# Patient Record
Sex: Male | Born: 1950 | Race: White | Hispanic: No | Marital: Married | Smoking: Never smoker
Health system: Southern US, Community
[De-identification: ages and names within clinical notes are randomized; demographics above are authoritative.]

## PROBLEM LIST (undated history)

## (undated) DIAGNOSIS — R011 Cardiac murmur, unspecified: Secondary | ICD-10-CM

## (undated) DIAGNOSIS — H269 Unspecified cataract: Secondary | ICD-10-CM

## (undated) DIAGNOSIS — I10 Essential (primary) hypertension: Secondary | ICD-10-CM

## (undated) DIAGNOSIS — M1990 Unspecified osteoarthritis, unspecified site: Secondary | ICD-10-CM

## (undated) HISTORY — DX: Unspecified osteoarthritis, unspecified site: M19.90

## (undated) HISTORY — DX: Essential (primary) hypertension: I10

## (undated) HISTORY — DX: Cardiac murmur, unspecified: R01.1

## (undated) HISTORY — DX: Unspecified cataract: H26.9

## (undated) HISTORY — PX: HERNIA REPAIR: SHX51

## (undated) HISTORY — PX: CARDIAC VALVE REPLACEMENT: SHX585

---

## 2000-10-29 ENCOUNTER — Ambulatory Visit (HOSPITAL_COMMUNITY): Admission: RE | Admit: 2000-10-29 | Discharge: 2000-10-29 | Payer: Self-pay | Admitting: General Surgery

## 2000-10-29 ENCOUNTER — Encounter (INDEPENDENT_AMBULATORY_CARE_PROVIDER_SITE_OTHER): Payer: Self-pay | Admitting: Specialist

## 2005-02-20 ENCOUNTER — Ambulatory Visit: Payer: Self-pay | Admitting: Family Medicine

## 2005-02-28 ENCOUNTER — Ambulatory Visit: Payer: Self-pay | Admitting: Family Medicine

## 2009-03-29 ENCOUNTER — Encounter (INDEPENDENT_AMBULATORY_CARE_PROVIDER_SITE_OTHER): Payer: Self-pay | Admitting: *Deleted

## 2009-03-30 ENCOUNTER — Ambulatory Visit: Payer: Self-pay | Admitting: Gastroenterology

## 2009-04-04 ENCOUNTER — Ambulatory Visit: Payer: Self-pay | Admitting: Gastroenterology

## 2009-04-05 ENCOUNTER — Encounter: Payer: Self-pay | Admitting: Gastroenterology

## 2010-04-03 NOTE — Letter (Signed)
Summary: [name] Instructions  [facility]  [address] [place], [place] [address]   Phone: [phone]  Fax: [phone]       Jeremy Yates    [birth year]    MRN: [id number]        Procedure Day [name]:  [name]  04/04/09     Arrival Time:  9:00AM     Procedure Time:  10:00AM     Location of Procedure:                    _ X_  [facility] (4th Floor)     PREPARATION FOR COLONOSCOPY WITH MOVIPREP   Starting 5 days prior to your procedure 03/30/09 do not eat nuts, seeds, popcorn, corn, beans, peas,  salads, or any raw vegetables.  Do not take any fiber supplements (e.g. Metamucil, Citrucel, and Benefiber).  THE DAY BEFORE YOUR PROCEDURE         DATE: 04/03/09 DAY: MONDAY  1.  Drink clear liquids the entire day-NO SOLID FOOD  2.  Do not drink anything colored red or purple.  Avoid juices with pulp.  No orange juice.  3.  Drink at least 64 oz. (8 glasses) of fluid/clear liquids during the day to prevent dehydration and help the prep work efficiently.  CLEAR LIQUIDS INCLUDE: Water Jello Ice Popsicles Tea (sugar ok, no milk/cream) Powdered fruit flavored drinks Coffee (sugar ok, no milk/cream) Gatorade Juice: apple, white grape, white cranberry  Lemonade Clear bullion, consomm, broth Carbonated beverages (any kind) Strained chicken noodle soup Hard Candy                             4.  In the morning, mix first dose of MoviPrep solution:    Empty 1 Pouch A and 1 Pouch B into the disposable container    Add lukewarm drinking water to the top line of the container. Mix to dissolve    Refrigerate (mixed solution should be used within 24 hrs)  5.  Begin drinking the prep at 5:00 p.m. The MoviPrep container is divided by 4 marks.   Every 15 minutes drink the solution down to the next mark (approximately 8 oz) until the full liter is complete.   6.  Follow completed prep with 16 oz of clear liquid of your choice (Nothing red or purple).   Continue to drink clear liquids until bedtime.  7.  Before going to bed, mix second dose of MoviPrep solution:    Empty 1 Pouch A and 1 Pouch B into the disposable container    Add lukewarm drinking water to the top line of the container. Mix to dissolve    Refrigerate  THE DAY OF YOUR PROCEDURE      DATE: 04/04/09 DAY: TUESDAY  Beginning at 5:00AM (5 hours before procedure):         1. Every 15 minutes, drink the solution down to the next mark (approx 8 oz) until the full liter is complete.  2. Follow completed prep with 16 oz. of clear liquid of your choice.    3. You may drink clear liquids until 8:00AM (2 HOURS BEFORE PROCEDURE).   MEDICATION INSTRUCTIONS  Unless otherwise instructed, you should take regular prescription medications with a small sip of water   as early as possible the morning of your procedure.          OTHER INSTRUCTIONS  You will need a responsible adult  at least 58 years of age to accompany you and drive you home (at least 58 years of age).   This person must remain in the waiting room during your procedure.  Wear loose fitting clothing that is easily removed.  Leave jewelry and other valuables at home.  However, you may wish to bring a book to read or  an iPod/MP3 player to listen to music as you wait for your procedure to start.  Remove all body piercing jewelry and leave at home.  Total time from sign-in until discharge is approximately 2-3 hours.  You should go home directly after your procedure and rest.  You can resume normal activities the  day after your procedure.  The day of your procedure you should not:   Drive   Make legal decisions   Operate machinery   Drink alcohol   Return to work  You will receive specific instructions about eating, activities and medications before you leave.    The above instructions have been reviewed and explained to me by  Karl Bales RN  March 30, 2009 8:13 AM       I fully understand and can verbalize  these instructions _____________________________ Date _________

## 2010-04-03 NOTE — Procedures (Signed)
Summary: Colonoscopy  Patient: Jeremy Yates Note: All result statuses are Final unless otherwise noted.  Tests: (1) Colonoscopy (COL)   COL Colonoscopy           DONE     [facility]     [address]     [place], [place]  [address]           COLONOSCOPY PROCEDURE REPORT           PATIENT:  Jeremy Yates, Jeremy Yates  MR#:  [id number]     BIRTHDATE:  03/04/1951, 58 yrs. old  GENDER:  male           ENDOSCOPIST:  Judie Petit T. Russella Dar, MD, [facility]           PROCEDURE DATE:  04/04/2009     PROCEDURE:  Colonoscopy with snare polypectomy     ASA CLASS:  Class II     INDICATIONS:  1) Routine Risk Screening           MEDICATIONS:   Fentanyl 50 mcg IV, Versed 6 mg IV           DESCRIPTION OF PROCEDURE:   After the risks benefits and     alternatives of the procedure were thoroughly explained, informed     consent was obtained.  Digital rectal exam was performed and     revealed no abnormalities.   The LB [id number] endoscope     was introduced through the anus and advanced to the cecum, which     was identified by both the appendix and ileocecal valve, without     limitations.  The quality of the prep was good, using MoviPrep.     The instrument was then slowly withdrawn as the colon was fully     examined.     <<PROCEDUREIMAGES>>           FINDINGS:  A sessile polyp was found in the ascending colon. It     was 6 mm in size.  Polyp was snared without cautery. Retieval was     successful. A sessile polyp was found in the descending colon. It     was 5 mm in size. Polyp was snared without cautery. Retrieval was     successful. A sessile polyp was found in the sigmoid colon. It was     3 mm in size. Polyp was snared without cautery. Retrieval was     successful. This was otherwise a normal examination of the colon.     Retroflexed views in the rectum revealed no abnormalities. The     time to cecum =  1.5  minutes. The scope was then withdrawn (time     =  11  min) from the patient and  the procedure completed.           COMPLICATIONS:  None           ENDOSCOPIC IMPRESSION:     1) 6 mm sessile polyp in the ascending colon     2) 5 mm sessile polyp in the descending colon     3) 3 mm sessile polyp in the sigmoid colon           RECOMMENDATIONS:     1) Await pathology results     2) If the polyps removed today are adenomatous (pre-cancerous),     you will need a repeat colonoscopy in 5 years. Otherwise you     should continue to follow colorectal cancer screening guidelines  for "routine risk" patients with colonoscopy in 10 years.     Venita Lick. Russella Dar, MD, [facility]           CC: Tawny Asal, MD           n.     Rosalie DoctorVenita Lick. Anysha Frappier at 04/04/2009 10:46 AM           Carren Rang, [id number]  Note: An exclamation mark (!) indicates a result that was not dispersed into the flowsheet. Document Creation Date: 04/04/2009 10:47 AM _______________________________________________________________________  (1) Order result status: Final Collection or observation date-time: 04/04/2009 10:40 Requested date-time:  Receipt date-time:  Reported date-time:  Referring Physician:   Ordering Physician: Claudette Head [phone]) Specimen Source:  Source: Launa Grill Order Number: [id number] Lab site:   Appended Document: Colonoscopy     Procedures Next Due Date:    Colonoscopy: 04/2014  Appended Document: Colonoscopy reviewed

## 2010-04-03 NOTE — Miscellaneous (Signed)
Summary: [facility]  Clinical Lists Changes

## 2010-04-03 NOTE — Miscellaneous (Signed)
Summary: moviprep rx  Clinical Lists Changes  Medications: Added new medication of MOVIPREP 100 GM  SOLR (PEG-KCL-NACL-NASULF-NA ASC-C) As per prep instructions. - Signed Rx of MOVIPREP 100 GM  SOLR (PEG-KCL-NACL-NASULF-NA ASC-C) As per prep instructions.;  #1 x 0;  Signed;  Entered by: Karl Bales RN;  Authorized by: Meryl Dare MD [facility];  Method used: Electronically to [facility]*, [address] [place], [place], [place]  [address], Ph: [phone], Fax: [phone] Observations: Added new observation of NKA: T (03/30/2009 8:11)    Prescriptions: MOVIPREP 100 GM  SOLR (PEG-KCL-NACL-NASULF-NA ASC-C) As per prep instructions.  #1 x 0   Entered by:   Karl Bales RN   Authorized by:   Meryl Dare MD [facility]   Signed by:   Karl Bales RN on 03/30/2009   Method used:   Electronically to        [facility]* (retail)       [address] [place], [place]  [address]       Ph: [phone]       Fax: [phone]   RxID:   [id number]

## 2010-04-03 NOTE — Letter (Signed)
Summary: Patient Notice- Polyp Results  [facility]  [address] [place], [place] [address]   Phone: [phone]  Fax: [phone]        April 05, 2009 MRN: [id number]    ARLENE GENOVA [address] [place], [place]  [address]    Dear Mr. LIBY,  I am pleased to inform you that the colon polyp(s) removed during your recent colonoscopy was (were) found to be benign (no cancer detected) upon pathologic examination.  I recommend you have a repeat colonoscopy examination in 5 years to look for recurrent polyps, as having colon polyps increases your risk for having recurrent polyps or even colon cancer in the future.  Should you develop new or worsening symptoms of abdominal pain, bowel habit changes or bleeding from the rectum or bowels, please schedule an evaluation with either your primary care physician or with me.  Continue treatment plan as outlined the day of your exam.  Please call us if you are having persistent problems or have questions about your condition that have not been fully answered at this time.  Sincerely,  Meryl Dare MD [facility]  This letter has been electronically signed by your physician.  Appended Document: Patient Notice- Polyp Results letter mailed 2.3.11

## 2010-07-20 NOTE — Op Note (Signed)
[facility]  Patient:    Jeremy Yates, Jeremy Yates Visit Number: [id number] MRN: [id number]          Service Type: DSU Location: DAY Attending Physician:  Carson Myrtle Proc. Date: 10/29/00 Adm. Date:  10/29/2000                             Operative Report  PREOPERATIVE DIAGNOSIS:  Chronic pilonidal cyst and abscess.  POSTOPERATIVE DIAGNOSIS:  Chronic pilonidal cyst and abscess.  OPERATIVE PROCEDURE:  Excision of pilonidal cyst and fistula.  SURGEON:  Timothy E. Earlene Plater, M.D.  ANESTHESIA:  General.  INDICATIONS:  [name] is newly moved to [place].  He had an incision drainage of pilonidal abscess in [place] about a month ago.  He has been on antibiotics, and he is now ready to proceed with a definitive excision.  DESCRIPTION OF PROCEDURE:  The patient was evaluated by anesthesia, taken to the operating room, general endotracheal anesthesia provided.  He was carefully taped in position and then turned in the prone position, again, carefully positioned and padded.  The buttocks were shaved and then slightly spread.  The fistulous opening was at the previous incision drainage site, approximately 3 cm to the left of the midline.  A malleable probe was placed there after prep and drape of the skin.  It did, in fact, probe to the midline.  An incision was made in a curvilinear fashion to remove all of the inflamed skin from the I&D site to the midline.  Careful sharp dissection removed all evidence of fistula, granulation tissue, and infection.  This was submitted to pathology.  Bleeding was controlled with the cautery, and the wound was closed in layers with 2-0 Vicryl and 2-0 nylon.  He tolerated it well.  Counts correct.  Dry sterile dressing applied, and he was then turned back into the supine position, extubated, and taken to the recovery room in good condition. Attending Physician:  Carson Myrtle DD:  10/29/00 TD:  10/29/00 Job:  [id number] [id number]

## 2014-04-15 ENCOUNTER — Encounter: Payer: Self-pay | Admitting: Gastroenterology

## 2014-08-22 ENCOUNTER — Ambulatory Visit (INDEPENDENT_AMBULATORY_CARE_PROVIDER_SITE_OTHER): Payer: BLUE CROSS/BLUE SHIELD | Admitting: Podiatry

## 2014-08-22 DIAGNOSIS — L600 Ingrowing nail: Secondary | ICD-10-CM

## 2014-08-22 MED ORDER — NEOMYCIN-POLYMYXIN-HC 3.5-10000-1 OT SOLN: OTIC | Status: DC

## 2014-08-22 NOTE — Progress Notes (Signed)
   Subjective:    Patient ID: Jeremy Yates, male    DOB: January 17, 1951, 64 y.o.   MRN: [id number]  HPI Pt presents with bilateral great ingrown nail medial border, he states he has successfuly tried to remove them for over the past 3 years but this time they are not going away   Review of Systems  All other systems reviewed and are negative.      Objective:   Physical Exam        Assessment & Plan:

## 2014-08-22 NOTE — Patient Instructions (Signed)

## 2014-08-23 NOTE — Progress Notes (Signed)
Subjective:     Patient ID: Jeremy Yates, male   DOB: 12-12-1950, 64 y.o.   MRN: [id number]  HPI patient presents stating my ingrown toenails are really bothering me and make it hard for me to wear shoe gear comfortably and that I have tried to trim them myself and soak them without relief   Review of Systems  All other systems reviewed and are negative.      Objective:   Physical Exam  Constitutional: He is oriented to person, place, and time.  Cardiovascular: Intact distal pulses.   Musculoskeletal: Normal range of motion.  Neurological: He is oriented to person, place, and time.  Skin: Skin is warm.  Nursing note and vitals reviewed.  neurovascular status intact muscle strength adequate with range of motion subtalar midtarsal joint found to be within normal limits. 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 I've recommended removal of the corners and I explained procedure and risk associated with this. Patient wants the procedures understanding risk and today I went ahead and I filtrated each hallux 60 mg Xylocaine Marcaine mixture remove the lateral borders exposed matrix and applied phenol 3 applications 30 seconds followed by alcohol lavage and sterile dressing. Gave instructions on soaks and reappoint

## 2014-08-31 ENCOUNTER — Ambulatory Visit: Payer: Self-pay | Admitting: Podiatry

## 2014-10-25 ENCOUNTER — Encounter: Payer: Self-pay | Admitting: Gastroenterology

## 2017-10-21 DIAGNOSIS — R430 Anosmia: Secondary | ICD-10-CM | POA: Insufficient documentation

## 2017-10-21 DIAGNOSIS — R432 Parageusia: Secondary | ICD-10-CM | POA: Insufficient documentation

## 2017-11-19 DIAGNOSIS — J343 Hypertrophy of nasal turbinates: Secondary | ICD-10-CM | POA: Insufficient documentation

## 2017-11-19 DIAGNOSIS — R040 Epistaxis: Secondary | ICD-10-CM

## 2017-11-19 DIAGNOSIS — J342 Deviated nasal septum: Secondary | ICD-10-CM | POA: Insufficient documentation

## 2017-11-19 HISTORY — DX: Epistaxis: R04.0

## 2018-08-15 LAB — LAB REPORT - SCANNED
A1c: 5.9
EGFR (Non-African Amer.): 86

## 2018-09-25 ENCOUNTER — Other Ambulatory Visit: Payer: Self-pay | Admitting: Family Medicine

## 2018-09-25 DIAGNOSIS — N401 Enlarged prostate with lower urinary tract symptoms: Secondary | ICD-10-CM

## 2018-09-25 DIAGNOSIS — N138 Other obstructive and reflux uropathy: Secondary | ICD-10-CM

## 2018-09-25 DIAGNOSIS — R972 Elevated prostate specific antigen [PSA]: Secondary | ICD-10-CM

## 2018-09-25 NOTE — Progress Notes (Signed)
PT w/ Urinary sx and increasing PSA w/ %free PSA of 17.2% which correlates w/ 23% risk malignancy given age

## 2018-10-29 ENCOUNTER — Ambulatory Visit
Admission: RE | Admit: 2018-10-29 | Discharge: 2018-10-29 | Disposition: A | Discharge status: Home / Self Care | Payer: PRIVATE HEALTH INSURANCE | Source: Ambulatory Visit | Attending: Family Medicine | Admitting: Family Medicine

## 2018-10-29 ENCOUNTER — Other Ambulatory Visit: Payer: Self-pay

## 2018-10-29 DIAGNOSIS — N401 Enlarged prostate with lower urinary tract symptoms: Secondary | ICD-10-CM

## 2018-10-29 DIAGNOSIS — R972 Elevated prostate specific antigen [PSA]: Secondary | ICD-10-CM

## 2018-10-29 DIAGNOSIS — N138 Other obstructive and reflux uropathy: Secondary | ICD-10-CM

## 2018-10-29 MED ORDER — GADOBENATE DIMEGLUMINE 529 MG/ML IV SOLN
20.0000 mL | Freq: Once | INTRAVENOUS | Status: AC | PRN
  Administered 2018-10-29: 10:00:00 20 mL via INTRAVENOUS

## 2019-04-03 ENCOUNTER — Ambulatory Visit: Payer: Self-pay

## 2019-04-08 ENCOUNTER — Ambulatory Visit: Payer: PRIVATE HEALTH INSURANCE

## 2019-04-08 ENCOUNTER — Ambulatory Visit: Payer: Self-pay

## 2019-04-10 ENCOUNTER — Ambulatory Visit: Payer: BC Managed Care – PPO | Attending: Internal Medicine

## 2019-04-10 DIAGNOSIS — Z23 Encounter for immunization: Secondary | ICD-10-CM | POA: Insufficient documentation

## 2019-04-10 NOTE — Progress Notes (Signed)
   Covid-19 Vaccination Clinic  Name:  Jeremy Yates    MRN: [id number] DOB: 06-11-1950  04/10/2019  Mr. Jeremy Yates was observed post Covid-19 immunization for 15 minutes without incidence. He was provided with Vaccine Information Sheet and instruction to access the V-Safe system.   Mr. Jeremy Yates was instructed to call 911 with any severe reactions post vaccine: Marland Kitchen Difficulty breathing  . Swelling of your face and throat  . A fast heartbeat  . A bad rash all over your body  . Dizziness and weakness    Immunizations Administered    Name Date Dose VIS Date Route   Pfizer COVID-19 Vaccine 04/10/2019  5:54 PM 0.3 mL 02/12/2019 Intramuscular   Manufacturer: [employer], [employer]   Lot: [id number]   NDC: [id number]

## 2019-05-05 ENCOUNTER — Ambulatory Visit: Payer: BC Managed Care – PPO | Attending: Internal Medicine

## 2019-05-05 DIAGNOSIS — Z23 Encounter for immunization: Secondary | ICD-10-CM | POA: Insufficient documentation

## 2019-05-05 NOTE — Progress Notes (Signed)
   Covid-19 Vaccination Clinic  Name:  Jeremy Yates    MRN: [id number] DOB: 05/05/1950  05/05/2019  [name] was observed post Covid-19 immunization for 15 minutes without incident. He was provided with Vaccine Information Sheet and instruction to access the V-Safe system.   [name] was instructed to call 911 with any severe reactions post vaccine: Marland Kitchen Difficulty breathing  . Swelling of face and throat  . A fast heartbeat  . A bad rash all over body  . Dizziness and weakness   Immunizations Administered    Name Date Dose VIS Date Route   Pfizer COVID-19 Vaccine 05/05/2019  2:35 PM 0.3 mL 02/12/2019 Intramuscular   Manufacturer: [employer], [employer]   Lot: [id number]   NDC: [address]-2

## 2019-06-25 ENCOUNTER — Other Ambulatory Visit: Payer: Self-pay | Admitting: Family Medicine

## 2019-06-25 DIAGNOSIS — Z125 Encounter for screening for malignant neoplasm of prostate: Secondary | ICD-10-CM

## 2019-06-25 DIAGNOSIS — R011 Cardiac murmur, unspecified: Secondary | ICD-10-CM

## 2019-06-25 DIAGNOSIS — R0989 Other specified symptoms and signs involving the circulatory and respiratory systems: Secondary | ICD-10-CM

## 2019-06-25 DIAGNOSIS — R3914 Feeling of incomplete bladder emptying: Secondary | ICD-10-CM

## 2019-06-25 DIAGNOSIS — N401 Enlarged prostate with lower urinary tract symptoms: Secondary | ICD-10-CM

## 2019-06-25 NOTE — Progress Notes (Signed)
Urinary hesitancy and incomplete bladder emptying continue. PSA climbing (slightly) from 1.5-1.9.  Previous Prostate MRI PIRADS-3 lesion noted.   Systolic murmur now IV/VI w/ bilat carotid bruits noted.   Shelly Flatten, MD Family Medicine 06/25/2019, 1:50 PM

## 2019-07-02 ENCOUNTER — Ambulatory Visit (HOSPITAL_BASED_OUTPATIENT_CLINIC_OR_DEPARTMENT_OTHER)
Admission: RE | Admit: 2019-07-02 | Discharge: 2019-07-02 | Disposition: A | Discharge status: Home / Self Care | Payer: BC Managed Care – PPO | Source: Ambulatory Visit | Attending: Family Medicine | Admitting: Family Medicine

## 2019-07-02 ENCOUNTER — Ambulatory Visit (HOSPITAL_COMMUNITY)
Admission: RE | Admit: 2019-07-02 | Discharge: 2019-07-02 | Disposition: A | Discharge status: Home / Self Care | Payer: BC Managed Care – PPO | Source: Ambulatory Visit | Attending: Family Medicine | Admitting: Family Medicine

## 2019-07-02 ENCOUNTER — Other Ambulatory Visit: Payer: Self-pay

## 2019-07-02 DIAGNOSIS — R011 Cardiac murmur, unspecified: Secondary | ICD-10-CM | POA: Diagnosis present

## 2019-07-02 DIAGNOSIS — R0989 Other specified symptoms and signs involving the circulatory and respiratory systems: Secondary | ICD-10-CM

## 2019-07-02 NOTE — Progress Notes (Signed)
  Echocardiogram 2D Echocardiogram has been performed.  Jeremy Yates 07/02/2019, 4:37 PM

## 2019-07-02 NOTE — Progress Notes (Signed)
Carotid duplex has been completed.   Preliminary results in CV Proc.   Blanch Media 07/02/2019 2:41 PM

## 2019-09-09 DIAGNOSIS — E6609 Other obesity due to excess calories: Secondary | ICD-10-CM | POA: Insufficient documentation

## 2019-09-10 DIAGNOSIS — Z953 Presence of xenogenic heart valve: Secondary | ICD-10-CM | POA: Insufficient documentation

## 2019-09-28 ENCOUNTER — Telehealth (HOSPITAL_COMMUNITY): Payer: Self-pay

## 2019-09-28 NOTE — Telephone Encounter (Signed)
Recv'ed CR referral, referral was signed by an NP. Faxed request to have an update referral sent over signed by a MD. Placed ppw in faxed awaiting folder.

## 2019-11-02 ENCOUNTER — Ambulatory Visit
Admission: RE | Admit: 2019-11-02 | Discharge: 2019-11-02 | Disposition: A | Discharge status: Home / Self Care | Payer: BC Managed Care – PPO | Source: Ambulatory Visit | Attending: Family Medicine | Admitting: Family Medicine

## 2019-11-02 DIAGNOSIS — Z125 Encounter for screening for malignant neoplasm of prostate: Secondary | ICD-10-CM

## 2019-11-02 DIAGNOSIS — R3914 Feeling of incomplete bladder emptying: Secondary | ICD-10-CM

## 2019-11-02 MED ORDER — GADOBENATE DIMEGLUMINE 529 MG/ML IV SOLN
20.0000 mL | Freq: Once | INTRAVENOUS | Status: AC | PRN
  Administered 2019-11-02: 20 mL via INTRAVENOUS

## 2019-12-10 ENCOUNTER — Other Ambulatory Visit: Payer: Self-pay | Admitting: Family Medicine

## 2019-12-10 ENCOUNTER — Ambulatory Visit
Admission: RE | Admit: 2019-12-10 | Discharge: 2019-12-10 | Disposition: A | Discharge status: Home / Self Care | Payer: BC Managed Care – PPO | Source: Ambulatory Visit | Attending: Family Medicine | Admitting: Family Medicine

## 2019-12-10 DIAGNOSIS — M25512 Pain in left shoulder: Secondary | ICD-10-CM

## 2020-07-07 ENCOUNTER — Other Ambulatory Visit: Payer: Self-pay | Admitting: Family Medicine

## 2020-07-07 ENCOUNTER — Ambulatory Visit (HOSPITAL_BASED_OUTPATIENT_CLINIC_OR_DEPARTMENT_OTHER)
Admission: RE | Admit: 2020-07-07 | Discharge: 2020-07-07 | Disposition: A | Discharge status: Home / Self Care | Payer: 59 | Source: Ambulatory Visit | Attending: Family Medicine | Admitting: Family Medicine

## 2020-07-07 ENCOUNTER — Other Ambulatory Visit: Payer: Self-pay

## 2020-07-07 ENCOUNTER — Encounter: Payer: Self-pay | Admitting: Family Medicine

## 2020-07-07 DIAGNOSIS — M25511 Pain in right shoulder: Secondary | ICD-10-CM

## 2020-07-07 DIAGNOSIS — M7541 Impingement syndrome of right shoulder: Secondary | ICD-10-CM

## 2020-07-07 HISTORY — DX: Impingement syndrome of right shoulder: M75.41

## 2020-07-07 NOTE — Progress Notes (Signed)
Rotator cuff impingement vs arthritis vs subacromial bursitis  S/p depomedrol injection 07/06/20 in PCP office.

## 2020-07-10 ENCOUNTER — Other Ambulatory Visit (HOSPITAL_BASED_OUTPATIENT_CLINIC_OR_DEPARTMENT_OTHER): Payer: Self-pay | Admitting: Family Medicine

## 2020-08-11 ENCOUNTER — Other Ambulatory Visit: Payer: Self-pay | Admitting: Orthopedic Surgery

## 2020-08-11 DIAGNOSIS — M67912 Unspecified disorder of synovium and tendon, left shoulder: Secondary | ICD-10-CM

## 2020-08-13 ENCOUNTER — Ambulatory Visit
Admission: RE | Admit: 2020-08-13 | Discharge: 2020-08-13 | Disposition: A | Discharge status: Home / Self Care | Payer: 59 | Source: Ambulatory Visit | Attending: Orthopedic Surgery | Admitting: Orthopedic Surgery

## 2020-08-13 DIAGNOSIS — M67912 Unspecified disorder of synovium and tendon, left shoulder: Secondary | ICD-10-CM

## 2020-08-14 ENCOUNTER — Other Ambulatory Visit: Payer: Self-pay | Admitting: Orthopedic Surgery

## 2020-08-14 DIAGNOSIS — M67911 Unspecified disorder of synovium and tendon, right shoulder: Secondary | ICD-10-CM

## 2020-12-02 DIAGNOSIS — N401 Enlarged prostate with lower urinary tract symptoms: Secondary | ICD-10-CM | POA: Insufficient documentation

## 2020-12-02 DIAGNOSIS — N400 Enlarged prostate without lower urinary tract symptoms: Secondary | ICD-10-CM | POA: Insufficient documentation

## 2020-12-02 DIAGNOSIS — I10 Essential (primary) hypertension: Secondary | ICD-10-CM | POA: Insufficient documentation

## 2021-01-03 ENCOUNTER — Other Ambulatory Visit: Payer: Self-pay | Admitting: Family Medicine

## 2021-01-03 DIAGNOSIS — N419 Inflammatory disease of prostate, unspecified: Secondary | ICD-10-CM

## 2021-01-03 DIAGNOSIS — R972 Elevated prostate specific antigen [PSA]: Secondary | ICD-10-CM

## 2021-01-24 ENCOUNTER — Ambulatory Visit
Admission: RE | Admit: 2021-01-24 | Discharge: 2021-01-24 | Disposition: A | Discharge status: Home / Self Care | Payer: 59 | Source: Ambulatory Visit | Attending: Family Medicine | Admitting: Family Medicine

## 2021-01-24 DIAGNOSIS — R972 Elevated prostate specific antigen [PSA]: Secondary | ICD-10-CM

## 2021-01-24 DIAGNOSIS — N419 Inflammatory disease of prostate, unspecified: Secondary | ICD-10-CM

## 2021-01-24 MED ORDER — GADOBENATE DIMEGLUMINE 529 MG/ML IV SOLN
20.0000 mL | Freq: Once | INTRAVENOUS | Status: AC | PRN
  Administered 2021-01-24: 20 mL via INTRAVENOUS

## 2021-02-05 ENCOUNTER — Ambulatory Visit: Payer: 59 | Attending: Internal Medicine

## 2021-02-05 ENCOUNTER — Other Ambulatory Visit (HOSPITAL_BASED_OUTPATIENT_CLINIC_OR_DEPARTMENT_OTHER): Payer: Self-pay

## 2021-02-05 ENCOUNTER — Other Ambulatory Visit: Payer: Self-pay

## 2021-02-05 DIAGNOSIS — Z23 Encounter for immunization: Secondary | ICD-10-CM

## 2021-02-05 MED ORDER — PFIZER COVID-19 VAC BIVALENT 30 MCG/0.3ML IM SUSP
INTRAMUSCULAR | 0 refills | Status: DC
  Filled 2021-02-05: qty 0.3, 1d supply, fill #0

## 2021-02-05 NOTE — Progress Notes (Signed)
   Covid-19 Vaccination Clinic  Name:  Jeremy Yates    MRN: [id number] DOB: December 01, 1950  02/05/2021  Mr. Jeremy Yates was observed post Covid-19 immunization for 15 minutes without incident. He was provided with Vaccine Information Sheet and instruction to access the V-Safe system.   Mr. Jeremy Yates was instructed to call 911 with any severe reactions post vaccine: Difficulty breathing  Swelling of face and throat  A fast heartbeat  A bad rash all over body  Dizziness and weakness   Immunizations Administered     Name Date Dose VIS Date Route   Pfizer Covid-19 Vaccine Bivalent Booster 02/05/2021 11:29 AM 0.3 mL 11/01/2020 Intramuscular   Manufacturer: [employer], [employer]   Lot: [id number]   NDC: [phone]

## 2021-03-14 ENCOUNTER — Ambulatory Visit (HOSPITAL_BASED_OUTPATIENT_CLINIC_OR_DEPARTMENT_OTHER): Payer: 59 | Admitting: Family Medicine

## 2021-03-19 ENCOUNTER — Ambulatory Visit (INDEPENDENT_AMBULATORY_CARE_PROVIDER_SITE_OTHER): Payer: Medicare Other | Admitting: Family Medicine

## 2021-03-19 ENCOUNTER — Other Ambulatory Visit: Payer: Self-pay

## 2021-03-19 ENCOUNTER — Encounter (HOSPITAL_BASED_OUTPATIENT_CLINIC_OR_DEPARTMENT_OTHER): Payer: Self-pay | Admitting: Family Medicine

## 2021-03-19 DIAGNOSIS — E785 Hyperlipidemia, unspecified: Secondary | ICD-10-CM | POA: Diagnosis not present

## 2021-03-19 DIAGNOSIS — N401 Enlarged prostate with lower urinary tract symptoms: Secondary | ICD-10-CM

## 2021-03-19 DIAGNOSIS — I10 Essential (primary) hypertension: Secondary | ICD-10-CM | POA: Diagnosis not present

## 2021-03-19 DIAGNOSIS — N400 Enlarged prostate without lower urinary tract symptoms: Secondary | ICD-10-CM | POA: Insufficient documentation

## 2021-03-19 NOTE — Assessment & Plan Note (Signed)
Tolerating pravastatin, will continue at this time Will request records to review recent lipid panels Continue with lifestyle modifications, particular related to aerobic exercise

## 2021-03-19 NOTE — Assessment & Plan Note (Signed)
Can continue with finasteride at this time Will request records to review prior PSA labs Reviewed recent MRIs which have been largely reassuring, no evidence of advanced prostate cancer

## 2021-03-19 NOTE — Assessment & Plan Note (Signed)
Blood pressure borderline in office today Patient reports good control at home We will continue with current medication regimen Discussed appropriate lifestyle modifications, recommend DASH diet Recommend continued monitoring at home

## 2021-03-19 NOTE — Patient Instructions (Signed)
°  Medication Instructions:  Your physician recommends that you continue on your current medications as directed. Please refer to the Current Medication list given to you today. --If you need a refill on any your medications before your next appointment, please call your pharmacy first. If no refills are authorized on file call the office.--  Follow-Up: Your next appointment:   Your physician recommends that you schedule a follow-up appointment in: 9 MONTHS for CPE with Dr. de Peru  You will receive a text message or e-mail with a link to a survey about your care and experience with [place] today! We would greatly appreciate your feedback!   Thanks for letting us be apart of your health journey!!  Primary Care and Sports Medicine   Dr. Ceasar Mons Peru   We encourage you to activate your patient portal called "MyChart".  Sign up information is provided on this After Visit Summary.  MyChart is used to connect with patients for Virtual Visits (Telemedicine).  Patients are able to view lab/test results, encounter notes, upcoming appointments, etc.  Non-urgent messages can be sent to your provider as well. To learn more about what you can do with MyChart, please visit --  [email or web address].

## 2021-03-19 NOTE — Progress Notes (Signed)
New Patient Office Visit  Subjective:  Patient ID: Jeremy Yates, male    DOB: February 14, 1951  Age: 71 y.o. MRN: [id number]  CC:  Chief Complaint  Patient presents with   Establish Care    Prior PCP Dr. Konrad Dolores. No concerns or complaints today   Benign Prostatic Hypertrophy    Patient states he was previously being monitored for enlarged prostate. He states he had an MRI in OCtober of 2022 and his markers are continually declining indicating no risk of cancer. He states he does not require a referral to Urology at this time.     HPI Jeremy Yates is a 71 year old male presenting to establish in clinic.  He has current concerns as outlined above.  Past medical history significant for BPH, hypertension, hyperlipidemia.  Hypertension: Currently taking lisinopril-hydrochlorothiazide combination 10 mg - 12.5 mg.  Reports that blood pressure in the past has generally been well controlled.  Denies any issues with chest pain, headaches.  Hyperlipidemia: Currently taking pravastatin, has been tolerating well, reports that recent lipid panels have been well controlled.  BPH: Currently taking finasteride.  Will have some nighttime awakenings to urinate.  Indicates having elevated PSA in the past, has had annual MRI of prostate for monitoring.  Recent imaging has been without concerning findings for advanced prostate cancer.  Patient has also had prior aortic valve replacement.  Was found to have calcific bicuspid aortic valve and notable stenosis related to this.  Patient is currently retired, he was working for some gentle.  He was receiving his primary care through [facility] to before retirement.  He is originally from [place], has been living here for over 20 years. Exercises fairly regularly, also competes in senior games and shotput.  Hobbies include brewing beer and mead, doing yard work.  Past Medical History:  Diagnosis Date   Rotator cuff impingement syndrome of right shoulder 07/07/2020     History reviewed. No pertinent surgical history.  Family History  Problem Relation Age of Onset   Dementia Mother    Cancer Father        prostate   Coronary artery disease Father    Rheum arthritis Father     Social History   Socioeconomic History   Marital status: Married    Spouse name: Not on file   Number of children: Not on file   Years of education: Not on file   Highest education level: Not on file  Occupational History   Not on file  Tobacco Use   Smoking status: Never    Passive exposure: Never   Smokeless tobacco: Never  Vaping Use   Vaping Use: Never used  Substance and Sexual Activity   Alcohol use: Yes    Alcohol/week: 6.0 - 7.0 standard drinks    Types: 6 - 7 Glasses of wine per week   Drug use: Never   Sexual activity: Yes  Other Topics Concern   Not on file  Social History Narrative   Not on file   Social Determinants of Health   Financial Resource Strain: Not on file  Food Insecurity: Not on file  Transportation Needs: Not on file  Physical Activity: Not on file  Stress: Not on file  Social Connections: Not on file  Intimate Partner Violence: Not on file    Objective:   Today's Vitals: BP (!) 142/82    Pulse 70    Ht 5\' 7"  (1.702 m)    Wt 248 lb 6.4 oz (112.7 kg)  SpO2 98%    BMI 38.90 kg/m   Physical Exam  71 year old male in no acute distress Cardiovascular exam with regular rate and rhythm Lungs clear to auscultation bilaterally  Assessment & Plan:   Problem List Items Addressed This Visit       Cardiovascular and Mediastinum   Hypertension    Blood pressure borderline in office today Patient reports good control at home We will continue with current medication regimen Discussed appropriate lifestyle modifications, recommend DASH diet Recommend continued monitoring at home      Relevant Medications   pravastatin (PRAVACHOL) 40 MG tablet     Genitourinary   BPH (benign prostatic hyperplasia)    Can continue  with finasteride at this time Will request records to review prior PSA labs Reviewed recent MRIs which have been largely reassuring, no evidence of advanced prostate cancer      Relevant Medications   finasteride (PROSCAR) 5 MG tablet   alfuzosin (UROXATRAL) 10 MG 24 hr tablet     Other   Hyperlipidemia    Tolerating pravastatin, will continue at this time Will request records to review recent lipid panels Continue with lifestyle modifications, particular related to aerobic exercise      Relevant Medications   pravastatin (PRAVACHOL) 40 MG tablet    Outpatient Encounter Medications as of 03/19/2021  Medication Sig   alfuzosin (UROXATRAL) 10 MG 24 hr tablet Take 10 mg by mouth as needed.   aspirin 81 MG tablet Take 81 mg by mouth daily.   azelastine (ASTELIN) 0.1 % nasal spray Place into the nose.   finasteride (PROSCAR) 5 MG tablet Take 5 mg by mouth daily.   lisinopril-hydrochlorothiazide (PRINZIDE,ZESTORETIC) 20-12.5 MG per tablet Take 1 tablet by mouth daily.   methocarbamol (ROBAXIN) 500 MG tablet Take 500-1,000 mg by mouth 3 (three) times daily as needed.   neomycin-polymyxin-hydrocortisone (CORTISPORIN) otic solution Apply 1-2 drops to toe after soaking BID   pravastatin (PRAVACHOL) 40 MG tablet Take 40 mg by mouth daily.   [DISCONTINUED] COVID-19 mRNA bivalent vaccine, Pfizer, (PFIZER COVID-19 VAC BIVALENT) injection Inject into the muscle. (Patient not taking: Reported on 03/19/2021)   No facility-administered encounter medications on file as of 03/19/2021.    Follow-up: Return in about 9 months (around 12/17/2021).   Catalena Stanhope J De [place], MD

## 2021-08-02 IMAGING — MR MR SHOULDER*R* W/O CM
4 of 5 series · 20 of 40 positions shown · non-contrast
Comparison: Right shoulder radiograph 07/07/2020

CLINICAL DATA: Right right shoulder pain

EXAM:
MRI OF THE RIGHT SHOULDER WITHOUT CONTRAST
TECHNIQUE: Multiplanar, multisequence MR imaging of the shoulder was performed.
No intravenous contrast was administered.

[Series 6: T2 fat-sat · axial · right · 3.0mm · 0.53mm/px · z∈[-81,+9]mm · 7 of 27 slices shown (1 of 3)]
[im 1/27]
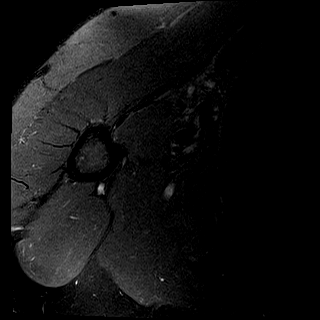
[im 3/27]
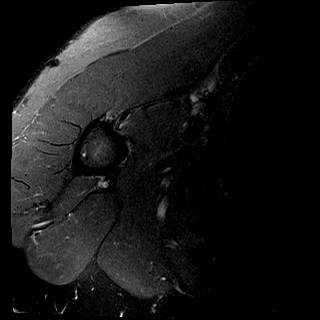
[im 9/27]
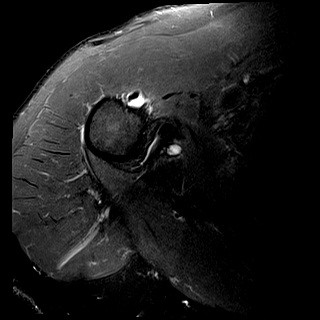
[im 12/27]
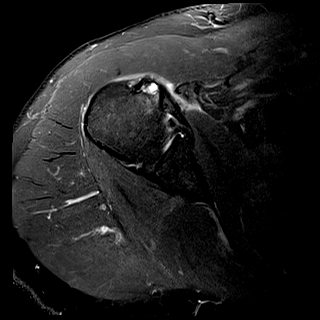
[im 15/27]
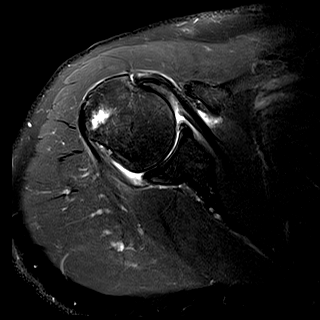
[im 18/27]
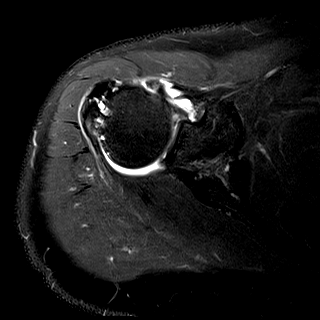
[im 24/27]
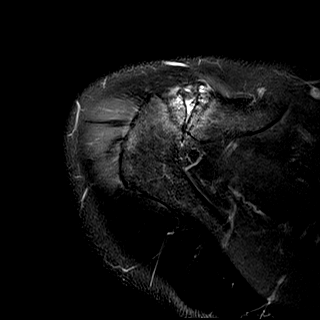

[Series 7: T2 fat-sat · oblique · right · 4.0mm · 0.23mm/px · 3 of 21 slices shown (2 of 3)]
[im 4/21]
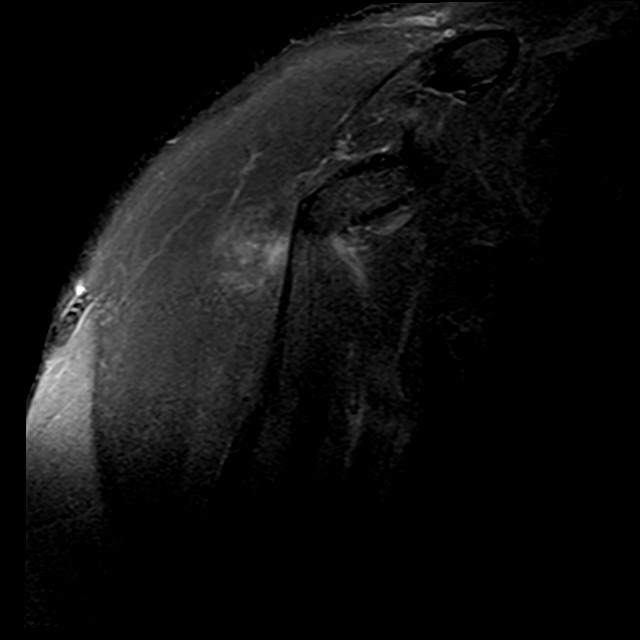
[im 11/21]
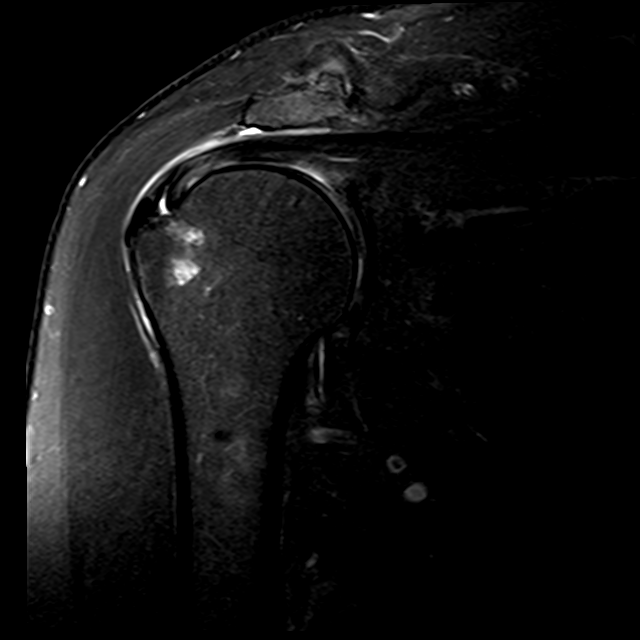
[im 17/21]
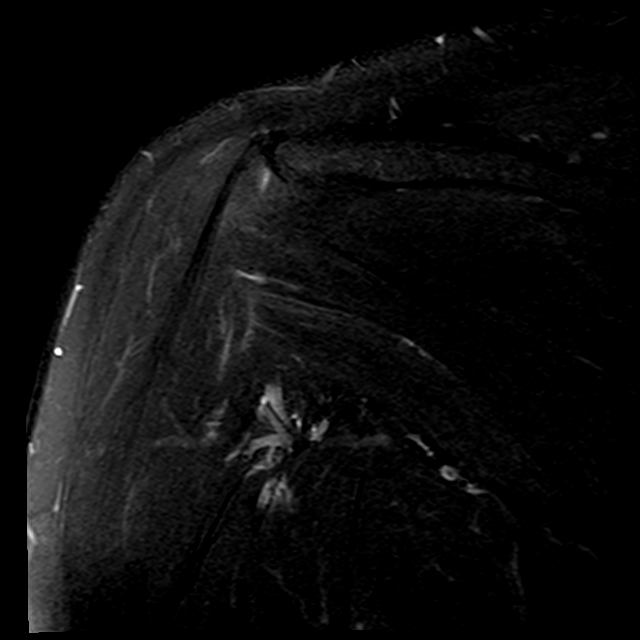

[Series 8: PD · oblique · right · 4.0mm · 0.23mm/px · 7 of 21 slices shown]
[im 1/21]
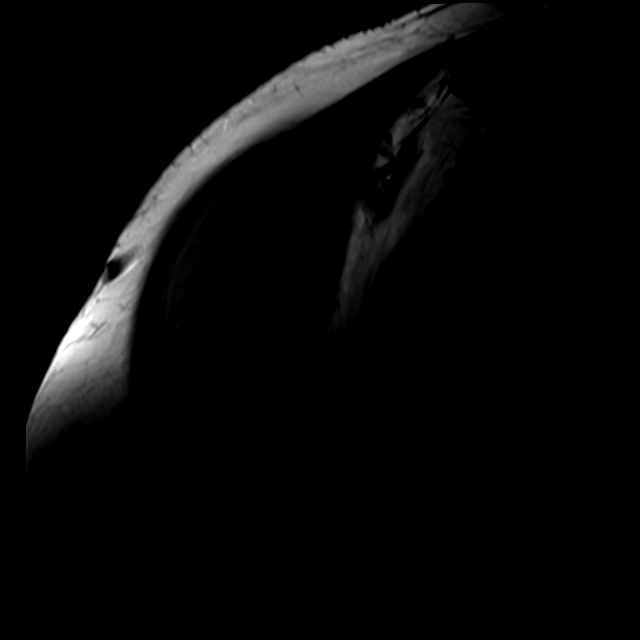
[im 4/21]
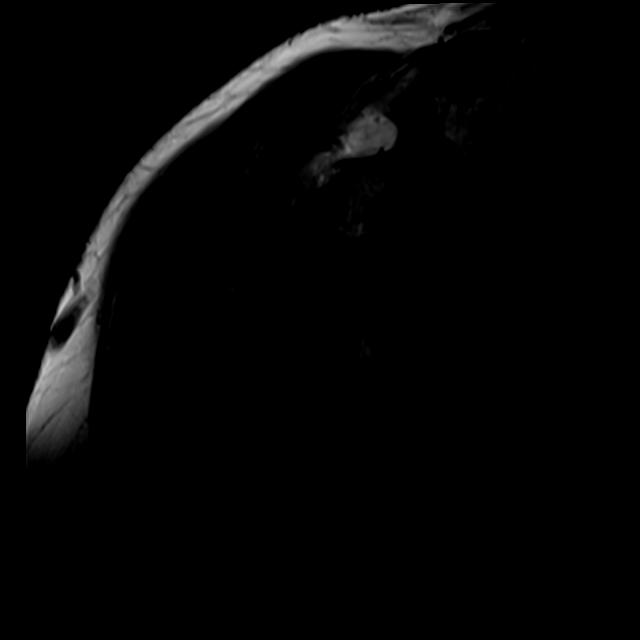
[im 7/21]
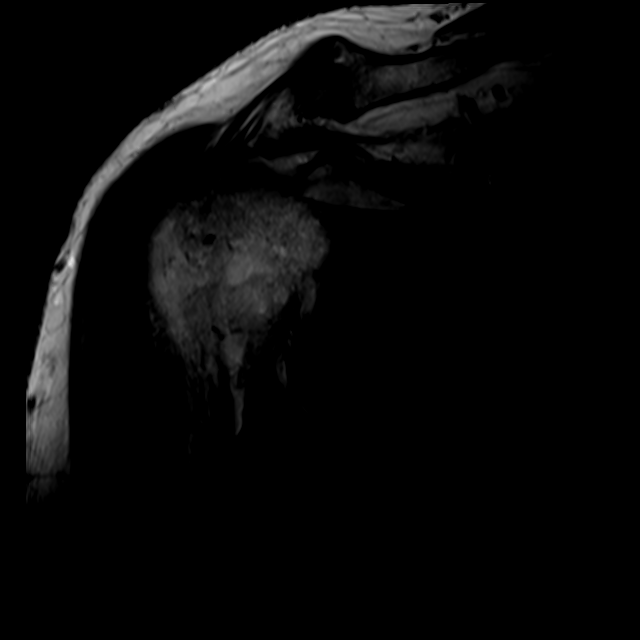
[im 11/21]
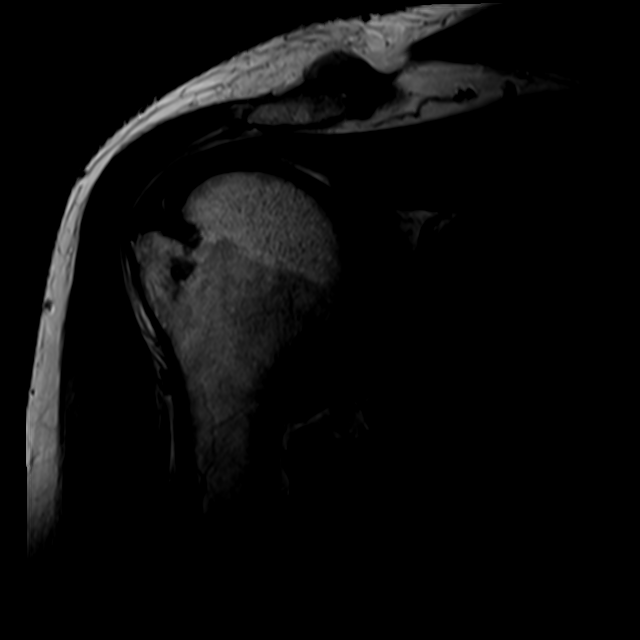
[im 14/21]
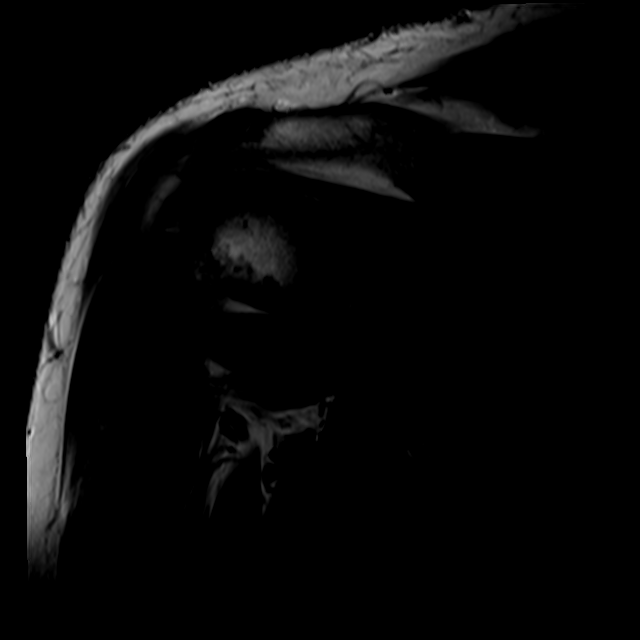
[im 17/21]
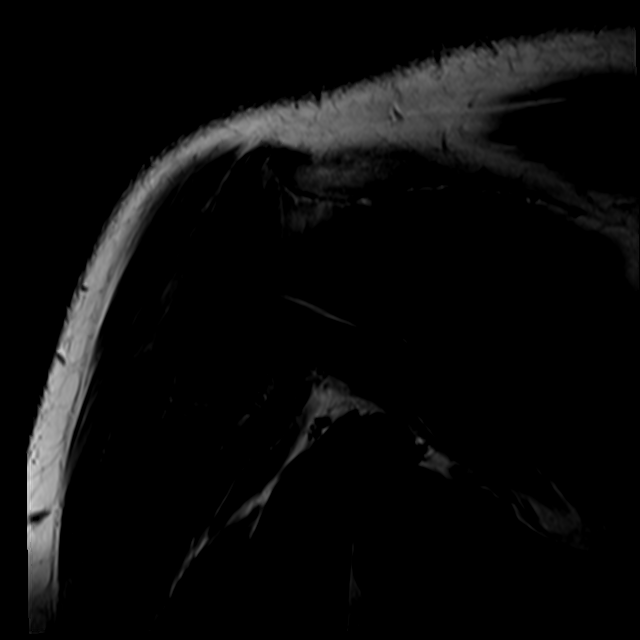
[im 21/21]
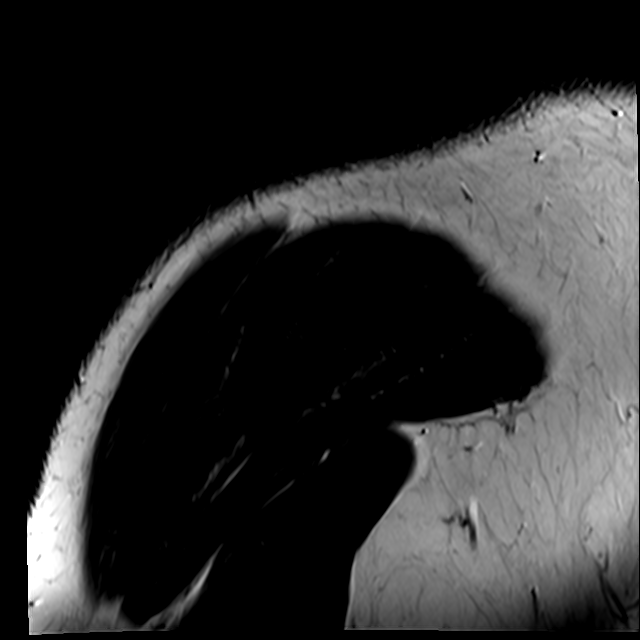

[Series 9: T2 fat-sat · oblique · right · 4.0mm · 0.47mm/px · 3 of 23 slices shown (3 of 3)]
[im 4/23]
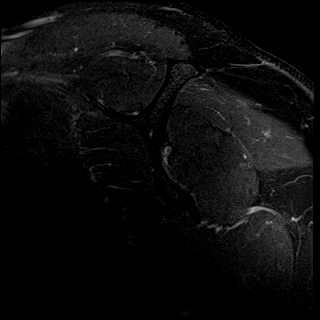
[im 13/23]
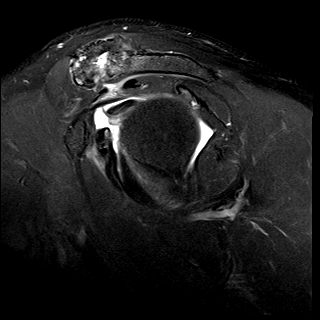
[im 19/23]
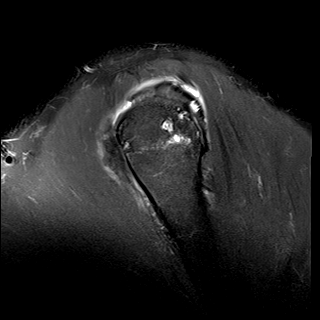

[20 of 40 positions shown; findings below may reference images not displayed]

FINDINGS: Rotator cuff: There is a full-thickness, partial width tear of the
fall anterior fibers of the supraspinatus tendon with approximately
3.0 cm retraction. The mid to posterior fibers demonstrates
tendinosis with articular and bursal sided fraying. There is
tendinosis and low-grade interstitial tearing of the distal
infraspinatus tendon at the footprint. Teres minor is intact. Distal
subscapularis tendinosis with intermediate grade articular sided
tearing of the cephalad fibers.

Muscles: No significant muscle atrophy.

Biceps Long Head: Intra-articular long head biceps tendinosis. There
is a slight medial ization of the long head biceps tendon as it
exits the bicipital groove into the subscapularis tear.

Acromioclavicular Joint: Moderate arthropathy of the
acromioclavicular joint. Small amount of subacromial/subdeltoid
bursal fluid due to cuff tear.

Glenohumeral Joint: No joint effusion. Mild-moderate chondrosis.

Labrum: Diffuse degenerative labral fraying with degenerative
superior labral tearing.

Bones: No fracture or dislocation. No aggressive osseous lesion.

Other: No fluid collection or hematoma.
IMPRESSION: Full-thickness, partial width tear of the far anterior fibers of the
supraspinatus tendon with approximately 3.0 cm retraction.
Tendinosis with articular and bursal sided fraying of the mid to
posterior fibers.

Tendinosis and low-grade interstitial tearing of the distal
infraspinatus tendon at the footprint.

Distal subscapularis tendinosis with intermediate grade articular
sided tearing of the cephalad fibers with slight medialization of
the long head biceps tendon as it enters the bicipital groove.
Intra-articular long head biceps tendinosis.

Mild glenohumeral chondrosis. Diffuse degenerative labral fraying
and superior labral tearing.

## 2021-08-20 ENCOUNTER — Ambulatory Visit: Payer: Medicare Other | Attending: Internal Medicine

## 2021-08-20 DIAGNOSIS — Z23 Encounter for immunization: Secondary | ICD-10-CM

## 2021-08-21 ENCOUNTER — Other Ambulatory Visit (HOSPITAL_BASED_OUTPATIENT_CLINIC_OR_DEPARTMENT_OTHER): Payer: Self-pay

## 2021-08-21 MED ORDER — PFIZER COVID-19 VAC BIVALENT 30 MCG/0.3ML IM SUSP
INTRAMUSCULAR | 0 refills | Status: DC
  Filled 2021-08-21: qty 0.3, 1d supply, fill #0

## 2021-08-21 NOTE — Progress Notes (Signed)
   Covid-19 Vaccination Clinic  Name:  Jeremy Yates    MRN: [id number] DOB: Feb 17, 1951  08/21/2021  [name] was observed post Covid-19 immunization for 15 minutes without incident. He was provided with Vaccine Information Sheet and instruction to access the V-Safe system.   [name] was instructed to call [phone] with any severe reactions post vaccine: Difficulty breathing  Swelling of face and throat  A fast heartbeat  A bad rash all over body  Dizziness and weakness   Immunizations Administered     Name Date Dose VIS Date Route   Pfizer Covid-19 Vaccine Bivalent Booster 08/20/2021  3:45 PM 0.3 mL 11/01/2020 Intramuscular   Manufacturer: [employer], [employer]   Lot: [id number]   NDC: [phone]

## 2021-09-19 ENCOUNTER — Other Ambulatory Visit (HOSPITAL_BASED_OUTPATIENT_CLINIC_OR_DEPARTMENT_OTHER): Payer: Self-pay

## 2021-10-26 ENCOUNTER — Ambulatory Visit (HOSPITAL_BASED_OUTPATIENT_CLINIC_OR_DEPARTMENT_OTHER): Payer: Medicare Other

## 2021-11-15 ENCOUNTER — Other Ambulatory Visit (HOSPITAL_BASED_OUTPATIENT_CLINIC_OR_DEPARTMENT_OTHER): Payer: Self-pay

## 2021-11-15 MED ORDER — INFLUENZA VAC A&B SA ADJ QUAD 0.5 ML IM PRSY
PREFILLED_SYRINGE | INTRAMUSCULAR | 0 refills | Status: DC
  Filled 2021-11-15: qty 0.5, 1d supply, fill #0

## 2021-12-12 ENCOUNTER — Ambulatory Visit (HOSPITAL_BASED_OUTPATIENT_CLINIC_OR_DEPARTMENT_OTHER): Payer: Medicare Other

## 2021-12-12 DIAGNOSIS — Z0000 Encounter for general adult medical examination without abnormal findings: Secondary | ICD-10-CM

## 2021-12-17 ENCOUNTER — Encounter (HOSPITAL_BASED_OUTPATIENT_CLINIC_OR_DEPARTMENT_OTHER): Payer: Medicare Other | Admitting: Nurse Practitioner

## 2021-12-17 ENCOUNTER — Encounter (HOSPITAL_BASED_OUTPATIENT_CLINIC_OR_DEPARTMENT_OTHER): Payer: Medicare Other | Admitting: Family Medicine

## 2021-12-27 ENCOUNTER — Encounter (HOSPITAL_BASED_OUTPATIENT_CLINIC_OR_DEPARTMENT_OTHER): Payer: Medicare Other | Admitting: Nurse Practitioner

## 2021-12-27 ENCOUNTER — Encounter (HOSPITAL_BASED_OUTPATIENT_CLINIC_OR_DEPARTMENT_OTHER): Payer: Self-pay | Admitting: Nurse Practitioner

## 2021-12-27 ENCOUNTER — Ambulatory Visit (HOSPITAL_BASED_OUTPATIENT_CLINIC_OR_DEPARTMENT_OTHER): Payer: Medicare Other | Admitting: Nurse Practitioner

## 2021-12-27 VITALS — BP 132/88 | HR 80 | Ht 67.0 in | Wt 263.0 lb

## 2021-12-27 DIAGNOSIS — M62830 Muscle spasm of back: Secondary | ICD-10-CM

## 2021-12-27 DIAGNOSIS — E782 Mixed hyperlipidemia: Secondary | ICD-10-CM

## 2021-12-27 DIAGNOSIS — N400 Enlarged prostate without lower urinary tract symptoms: Secondary | ICD-10-CM

## 2021-12-27 DIAGNOSIS — I10 Essential (primary) hypertension: Secondary | ICD-10-CM

## 2021-12-27 MED ORDER — METHOCARBAMOL 500 MG PO TABS: 500.0000 mg | ORAL_TABLET | Freq: Three times a day (TID) | ORAL | 11 refills | Status: DC | PRN

## 2021-12-27 MED ORDER — PRAVASTATIN SODIUM 20 MG PO TABS: 20.0000 mg | ORAL_TABLET | Freq: Every day | ORAL | 3 refills | Status: DC

## 2021-12-27 MED ORDER — ALFUZOSIN HCL ER 10 MG PO TB24: 10.0000 mg | ORAL_TABLET | ORAL | 3 refills | Status: DC | PRN

## 2021-12-27 MED ORDER — LISINOPRIL-HYDROCHLOROTHIAZIDE 20-12.5 MG PO TABS: 1.0000 | ORAL_TABLET | Freq: Every day | ORAL | 3 refills | Status: DC

## 2021-12-27 MED ORDER — FINASTERIDE 5 MG PO TABS: 5.0000 mg | ORAL_TABLET | Freq: Every day | ORAL | 3 refills | Status: DC

## 2021-12-27 NOTE — Patient Instructions (Signed)
  Jeremy Yates , Thank you for taking time to come for your Medicare Wellness Visit. I appreciate your ongoing commitment to your health goals. Please review the following plan we discussed and let me know if I can assist you in the future.   These are the goals we discussed:  Goals      Weight (lb) < 200 lb (90.7 kg)     Loose 25lbs        This is a list of the screening recommended for you and due dates:  Health Maintenance  Topic Date Due   Medicare Annual Wellness Visit  Never done   Hepatitis C Screening: USPSTF Recommendation to screen - Ages 66-79 yo.  Never done   Tetanus Vaccine  Never done   Zoster (Shingles) Vaccine (1 of 2) Never done   Colon Cancer Screening  04/05/2019   Pneumonia Vaccine (2 - PCV) 09/27/2019   Flu Shot  Completed   COVID-19 Vaccine  Completed   HPV Vaccine  Aged Out  I will correct these items.

## 2022-01-02 ENCOUNTER — Encounter (HOSPITAL_BASED_OUTPATIENT_CLINIC_OR_DEPARTMENT_OTHER): Payer: Medicare Other | Admitting: Family Medicine

## 2022-01-08 ENCOUNTER — Other Ambulatory Visit (HOSPITAL_BASED_OUTPATIENT_CLINIC_OR_DEPARTMENT_OTHER): Payer: Self-pay

## 2022-01-08 MED ORDER — COMIRNATY 30 MCG/0.3ML IM SUSY
PREFILLED_SYRINGE | INTRAMUSCULAR | 0 refills | Status: DC
  Filled 2022-01-08: qty 0.3, 1d supply, fill #0

## 2022-01-09 ENCOUNTER — Other Ambulatory Visit (HOSPITAL_BASED_OUTPATIENT_CLINIC_OR_DEPARTMENT_OTHER): Payer: Self-pay

## 2022-01-14 ENCOUNTER — Other Ambulatory Visit (HOSPITAL_BASED_OUTPATIENT_CLINIC_OR_DEPARTMENT_OTHER): Payer: Self-pay

## 2022-01-14 MED ORDER — AREXVY 120 MCG/0.5ML IM SUSR
INTRAMUSCULAR | 0 refills | Status: DC
  Filled 2022-01-14: qty 0.5, 1d supply, fill #0

## 2022-01-17 ENCOUNTER — Encounter (HOSPITAL_BASED_OUTPATIENT_CLINIC_OR_DEPARTMENT_OTHER): Payer: Self-pay | Admitting: Nurse Practitioner

## 2022-01-17 DIAGNOSIS — I10 Essential (primary) hypertension: Secondary | ICD-10-CM

## 2022-01-17 DIAGNOSIS — R972 Elevated prostate specific antigen [PSA]: Secondary | ICD-10-CM

## 2022-01-17 DIAGNOSIS — N401 Enlarged prostate with lower urinary tract symptoms: Secondary | ICD-10-CM

## 2022-01-19 MED ORDER — LISINOPRIL-HYDROCHLOROTHIAZIDE 10-12.5 MG PO TABS: 1.0000 | ORAL_TABLET | Freq: Every day | ORAL | 3 refills | Status: DC

## 2022-01-28 ENCOUNTER — Encounter: Payer: Self-pay | Admitting: Nurse Practitioner

## 2022-01-28 ENCOUNTER — Other Ambulatory Visit (HOSPITAL_BASED_OUTPATIENT_CLINIC_OR_DEPARTMENT_OTHER): Payer: Self-pay

## 2022-01-28 MED ORDER — BOOSTRIX 5-2.5-18.5 LF-MCG/0.5 IM SUSY
PREFILLED_SYRINGE | INTRAMUSCULAR | 0 refills | Status: DC
  Filled 2022-01-28: qty 0.5, 1d supply, fill #0

## 2022-02-19 ENCOUNTER — Ambulatory Visit
Admission: RE | Admit: 2022-02-19 | Discharge: 2022-02-19 | Disposition: A | Discharge status: Home / Self Care | Payer: Medicare Other | Source: Ambulatory Visit | Attending: Nurse Practitioner | Admitting: Nurse Practitioner

## 2022-02-19 DIAGNOSIS — N400 Enlarged prostate without lower urinary tract symptoms: Secondary | ICD-10-CM

## 2022-02-19 MED ORDER — GADOPICLENOL 0.5 MMOL/ML IV SOLN
10.0000 mL | Freq: Once | INTRAVENOUS | Status: AC | PRN
  Administered 2022-02-19: 10 mL via INTRAVENOUS

## 2022-02-22 ENCOUNTER — Encounter: Payer: Self-pay | Admitting: Nurse Practitioner

## 2022-05-10 LAB — HM COLONOSCOPY

## 2022-06-04 ENCOUNTER — Other Ambulatory Visit (HOSPITAL_BASED_OUTPATIENT_CLINIC_OR_DEPARTMENT_OTHER): Payer: Self-pay

## 2022-06-04 MED ORDER — COVID-19 MRNA VAC-TRIS(PFIZER) 30 MCG/0.3ML IM SUSY
0.3000 mL | PREFILLED_SYRINGE | Freq: Once | INTRAMUSCULAR | 0 refills | Status: AC
  Filled 2022-06-04: qty 0.3, 1d supply, fill #0

## 2022-06-05 ENCOUNTER — Other Ambulatory Visit (HOSPITAL_BASED_OUTPATIENT_CLINIC_OR_DEPARTMENT_OTHER): Payer: Self-pay

## 2022-07-03 ENCOUNTER — Encounter: Payer: Self-pay | Admitting: Nurse Practitioner

## 2022-08-05 DIAGNOSIS — M6530 Trigger finger, unspecified finger: Secondary | ICD-10-CM | POA: Insufficient documentation

## 2022-11-15 ENCOUNTER — Other Ambulatory Visit (HOSPITAL_BASED_OUTPATIENT_CLINIC_OR_DEPARTMENT_OTHER): Payer: Self-pay

## 2022-11-15 MED ORDER — COVID-19 MRNA VAC-TRIS(PFIZER) 30 MCG/0.3ML IM SUSY
0.3000 mL | PREFILLED_SYRINGE | Freq: Once | INTRAMUSCULAR | 0 refills | Status: AC
  Filled 2022-11-15: qty 0.3, 1d supply, fill #0

## 2022-11-20 ENCOUNTER — Other Ambulatory Visit (HOSPITAL_BASED_OUTPATIENT_CLINIC_OR_DEPARTMENT_OTHER): Payer: Self-pay

## 2022-11-20 MED ORDER — FLUAD 0.5 ML IM SUSY
PREFILLED_SYRINGE | INTRAMUSCULAR | 0 refills | Status: DC
  Filled 2022-11-20: qty 0.5, 1d supply, fill #0

## 2022-12-06 ENCOUNTER — Other Ambulatory Visit (HOSPITAL_BASED_OUTPATIENT_CLINIC_OR_DEPARTMENT_OTHER): Payer: Self-pay | Admitting: Nurse Practitioner

## 2022-12-06 DIAGNOSIS — I10 Essential (primary) hypertension: Secondary | ICD-10-CM

## 2022-12-06 DIAGNOSIS — E782 Mixed hyperlipidemia: Secondary | ICD-10-CM

## 2022-12-11 ENCOUNTER — Other Ambulatory Visit (HOSPITAL_BASED_OUTPATIENT_CLINIC_OR_DEPARTMENT_OTHER): Payer: Self-pay | Admitting: Nurse Practitioner

## 2022-12-11 DIAGNOSIS — I10 Essential (primary) hypertension: Secondary | ICD-10-CM

## 2023-02-20 ENCOUNTER — Encounter: Payer: Self-pay | Admitting: Nurse Practitioner

## 2023-02-20 DIAGNOSIS — I10 Essential (primary) hypertension: Secondary | ICD-10-CM

## 2023-02-20 DIAGNOSIS — E6609 Other obesity due to excess calories: Secondary | ICD-10-CM

## 2023-02-20 DIAGNOSIS — E66812 Obesity, class 2: Secondary | ICD-10-CM

## 2023-02-20 DIAGNOSIS — N401 Enlarged prostate with lower urinary tract symptoms: Secondary | ICD-10-CM

## 2023-02-20 DIAGNOSIS — E785 Hyperlipidemia, unspecified: Secondary | ICD-10-CM

## 2023-02-20 DIAGNOSIS — Z131 Encounter for screening for diabetes mellitus: Secondary | ICD-10-CM

## 2023-02-20 DIAGNOSIS — Z0000 Encounter for general adult medical examination without abnormal findings: Secondary | ICD-10-CM

## 2023-03-04 ENCOUNTER — Other Ambulatory Visit: Payer: Self-pay | Admitting: Nurse Practitioner

## 2023-03-04 ENCOUNTER — Other Ambulatory Visit (HOSPITAL_BASED_OUTPATIENT_CLINIC_OR_DEPARTMENT_OTHER): Payer: Self-pay | Admitting: Nurse Practitioner

## 2023-03-04 DIAGNOSIS — N400 Enlarged prostate without lower urinary tract symptoms: Secondary | ICD-10-CM

## 2023-03-05 LAB — COMPREHENSIVE METABOLIC PANEL
ALT: 19 [IU]/L (ref 0–44)
AST: 23 [IU]/L (ref 0–40)
Albumin: 4.4 g/dL (ref 3.9–4.9)
Alkaline Phosphatase: 73 [IU]/L (ref 44–121)
BUN/Creatinine Ratio: 24 (ref 10–24)
BUN: 21 mg/dL (ref 8–27)
Bilirubin Total: 0.5 mg/dL (ref 0.0–1.2)
CO2: 24 mmol/L (ref 20–29)
Calcium: 9.2 mg/dL (ref 8.6–10.2)
Chloride: 102 mmol/L (ref 96–106)
Creatinine, Ser: 0.89 mg/dL (ref 0.76–1.27)
Globulin, Total: 2.7 g/dL (ref 1.5–4.5)
Glucose: 106 mg/dL — ABNORMAL HIGH (ref 70–99)
Potassium: 4.2 mmol/L (ref 3.5–5.2)
Sodium: 140 mmol/L (ref 134–144)
Total Protein: 7.1 g/dL (ref 6.0–8.5)
eGFR: 92 mL/min/{1.73_m2} (ref >59)

## 2023-03-05 LAB — CBC WITH DIFFERENTIAL/PLATELET
Basophils Absolute: 0 10*3/uL (ref 0.0–0.2)
Basos: 1 %
EOS (ABSOLUTE): 0.2 10*3/uL (ref 0.0–0.4)
Eos: 3 %
Hematocrit: 42.7 % (ref 37.5–51.0)
Hemoglobin: 14.1 g/dL (ref 13.0–17.7)
Immature Grans (Abs): 0 10*3/uL (ref 0.0–0.1)
Immature Granulocytes: 0 %
Lymphocytes Absolute: 1.5 10*3/uL (ref 0.7–3.1)
Lymphs: 29 %
MCH: 30.7 pg (ref 26.6–33.0)
MCHC: 33 g/dL (ref 31.5–35.7)
MCV: 93 fL (ref 79–97)
Monocytes Absolute: 0.5 10*3/uL (ref 0.1–0.9)
Monocytes: 9 %
Neutrophils Absolute: 3.1 10*3/uL (ref 1.4–7.0)
Neutrophils: 58 %
Platelets: 231 10*3/uL (ref 150–450)
RBC: 4.6 x10E6/uL (ref 4.14–5.80)
RDW: 11.9 % (ref 11.6–15.4)
WBC: 5.3 10*3/uL (ref 3.4–10.8)

## 2023-03-05 LAB — LIPID PANEL
Chol/HDL Ratio: 3.4 {ratio} (ref 0.0–5.0)
Cholesterol, Total: 168 mg/dL (ref 100–199)
HDL: 50 mg/dL (ref >39)
LDL Chol Calc (NIH): 92 mg/dL (ref 0–99)
Triglycerides: 147 mg/dL (ref 0–149)
VLDL Cholesterol Cal: 26 mg/dL (ref 5–40)

## 2023-03-05 LAB — HEMOGLOBIN A1C
Est. average glucose Bld gHb Est-mCnc: 128 mg/dL
Hgb A1c MFr Bld: 6.1 % — ABNORMAL HIGH (ref 4.8–5.6)

## 2023-03-06 ENCOUNTER — Encounter: Payer: Self-pay | Admitting: Nurse Practitioner

## 2023-03-06 NOTE — Telephone Encounter (Signed)
 Please let Jeremy Yates know I did not order a PSA since his labs have been consistently stable and typically we check this every other year when there have not been changes in 2 years.    I have asked [facility] to add this on, if they need a separate vial of blood specifically for this test, we will let him know.

## 2023-03-07 LAB — PSA TOTAL (REFLEX TO FREE): Prostate Specific Ag, Serum: 2.1 ng/mL (ref 0.0–4.0)

## 2023-03-07 LAB — SPECIMEN STATUS REPORT

## 2023-03-11 ENCOUNTER — Ambulatory Visit: Payer: Medicare Other | Admitting: Nurse Practitioner

## 2023-03-11 ENCOUNTER — Encounter: Payer: Self-pay | Admitting: Nurse Practitioner

## 2023-03-11 VITALS — BP 134/82 | HR 80 | Ht 65.0 in | Wt 271.0 lb

## 2023-03-11 DIAGNOSIS — I10 Essential (primary) hypertension: Secondary | ICD-10-CM | POA: Diagnosis not present

## 2023-03-11 DIAGNOSIS — E782 Mixed hyperlipidemia: Secondary | ICD-10-CM

## 2023-03-11 DIAGNOSIS — N400 Enlarged prostate without lower urinary tract symptoms: Secondary | ICD-10-CM

## 2023-03-11 DIAGNOSIS — Z0000 Encounter for general adult medical examination without abnormal findings: Secondary | ICD-10-CM

## 2023-03-11 DIAGNOSIS — Z953 Presence of xenogenic heart valve: Secondary | ICD-10-CM

## 2023-03-11 DIAGNOSIS — M62830 Muscle spasm of back: Secondary | ICD-10-CM

## 2023-03-11 DIAGNOSIS — N401 Enlarged prostate with lower urinary tract symptoms: Secondary | ICD-10-CM

## 2023-03-11 MED ORDER — LISINOPRIL-HYDROCHLOROTHIAZIDE 20-12.5 MG PO TABS: 1.0000 | ORAL_TABLET | Freq: Every day | ORAL | 3 refills | Status: DC

## 2023-03-11 MED ORDER — FINASTERIDE 5 MG PO TABS: 5.0000 mg | ORAL_TABLET | Freq: Every day | ORAL | 3 refills | Status: DC

## 2023-03-11 MED ORDER — METHOCARBAMOL 500 MG PO TABS: 500.0000 mg | ORAL_TABLET | Freq: Three times a day (TID) | ORAL | 11 refills | Status: AC | PRN

## 2023-03-11 MED ORDER — PRAVASTATIN SODIUM 20 MG PO TABS: 20.0000 mg | ORAL_TABLET | Freq: Every day | ORAL | 3 refills | Status: DC

## 2023-03-11 NOTE — Progress Notes (Signed)
 03/11/2023   Vitals:  BP 134/82   Pulse 80   Ht 5' 5 (1.651 m)   Wt 271 lb (122.9 kg)   BMI 45.10 kg/m   Body mass index is 45.1 kg/m. Jeremy Yates is a 73 y.o. male who presents for Subsequent Medicare Annual Wellness Exam  Care Team Members: Current Providers as of 03/11/2023 PCP: Oris Camie BRAVO, NP Encounter Provider: Oris Camie BRAVO, NP, starting on Tue Mar 11, 2023 12:00 AM Referring Provider: Oris Camie BRAVO, NP, starting on Tue Mar 11, 2023 12:00 AM Attending Provider: Oris Camie BRAVO, NP, starting on Mon Jan 13, 2023 11:17 AM (Active) Nurse Practitioner: Oris Camie BRAVO, NP, starting on Tue Mar 11, 2023  1:22 PM (Active)   Method of visit:  in person In the event virtual visit conducted, the patient consented to a virtual visit. Patient consented to have virtual visit and was identified by two identifiers.  Encounter participants: Patient: Jeremy Yates - located [facility] Patient Visit Location: In Office Nurse/Provider: Camie CHARLENA Oris - located Virtual Visit Location Provider: Office/Clinic Others (if applicable): patient only History of Present Illness The patient, a 73 year old with a history of hypertension and benign prostatic hyperplasia (BPH), presents for a routine follow-up. He reports a recent increase in home blood pressure readings, averaging in the 140s/80s, but denies any associated symptoms such as headaches, vision changes, or dizziness. The patient has been on Lisinopril -HCTZ 10-12.5mg  but is open to increasing the dose due to the elevated readings.  Regarding his BPH, the patient has been on Alfuzosin  and Finasteride  for several years, which has reportedly had a positive effect. He has noticed a gradual increase in his PSA levels over the years, but it remains within the normal range. The most recent PSA level was 2.1, slightly decreased from 2.2 the previous year. The patient has had three MRIs of the prostate, the most recent of which showed signs of benign hypertrophy but no  high-risk lesions. He reports a family history of prostate cancer in his father and expresses a desire to avoid surgical intervention if possible. The patient has noticed a decrease in the time he can delay urination once the urge is felt but denies any incontinence. He wishes to remain proactive about monitoring but does not feel he need to see urology yet.   The patient also reports a history of trigger finger, for which he underwent surgery. He describes some residual arthritic soreness and occasional spasms in his lower back muscles, which he manages with Methocarbamol  as needed. He has been proactive in managing these symptoms with daily stretching and exercise.  The patient is highly active, attending the gym daily for cardio and strength training. He reports no changes in bowel habits, shortness of breath, or chest pain. He has a history of rotator cuff and bicep tendon surgery and continues to perform specific exercises to maintain shoulder mobility. Despite his age, the patient reports feeling well overall and is committed to maintaining his health and fitness.  Review of Systems:  Neuro: Denies difficulty remembering daily tasks, people, or places.  Ear: Denies difficulty hearing or need to increase volume on television or telephone to hear Eye: Denies visual changes, difficulty reading normal print, or visual field deficits. Cardiac: Denies chest pain, palpitations, dizziness, shortness of breath, pain in lower extremities, or night time waking with shortness of breath. Lung: Denies shortness of breath, difficulty breathing, chronic cough, or dizziness.  GI: Denies changes in bowel habits, blood in stool, difficulty  passing stool, decreased intake of food or drink, nausea, or vomiting.  GU: Denies changes in urinary habits, dark urine, malodorous urine, increased or decreased urination, or urinary incontinence.  MSK: Denies weakness in extremities, difficulty walking, difficulty grasping, or  new MSK pain.  Skin: Denies changes to the skin, fragile skin, or increased bruising.  Constitution: Denies fatigue, weakness, or confusion.   Patient rating of health: same as this time last year  Clinical Intake:    Pain : No/denies pain     BMI - recorded: 45.12 Nutritional Status: BMI > 30  Obese Nutritional Risks: None Diabetes: No  Activities of Daily Living: Independent Ambulation: Independent Medication Administration: Independent Home Management: Independent  Barriers to Care Management & Learning: None  Do you feel unsafe in your current relationship?: No Do you feel physically threatened by others?: No Anyone hurting you at home, work, or school?: No Unable to ask?: No Information provided on Community resources: No  How often do you need to have someone help you when you read instructions, pamphlets, or other written materials from your doctor or pharmacy?: 1 - Never  Interpreter Needed?: No  Information entered by :: Garlen Reinig     03/11/2023    1:35 PM  Advanced Directives  Does Patient Have a Medical Advance Directive? Yes  Type of Estate Agent of [place];Living will  Does patient want to make changes to medical advance directive? Yes (ED - Information included in AVS)  Copy of Healthcare Power of Attorney in Chart? No - copy requested    Social Determinants of Health SDOH Screenings   Food Insecurity: No Food Insecurity (03/07/2023)  Housing: Low Risk  (03/07/2023)  Transportation Needs: No Transportation Needs (03/07/2023)  Alcohol Screen: Low Risk  (03/07/2023)  Depression (PHQ2-9): Low Risk  (03/11/2023)  Financial Resource Strain: Low Risk  (03/07/2023)  Physical Activity: Sufficiently Active (03/07/2023)  Social Connections: Socially Integrated (03/07/2023)  Stress: No Stress Concern Present (03/07/2023)  Tobacco Use: Low Risk  (03/11/2023)     Functional Status Survey: Is the patient deaf or have difficulty hearing?: No Does the  patient have difficulty seeing, even when wearing glasses/contacts?: No Does the patient have difficulty concentrating, remembering, or making decisions?: No Does the patient have difficulty walking or climbing stairs?: No Does the patient have difficulty dressing or bathing?: No Does the patient have difficulty doing errands alone such as visiting a doctor's office or shopping?: No   Annual Goal:  Goals       Patient Stated (pt-stated)      Lose weight      Weight (lb) < 200 lb (90.7 kg)      Loose 25lbs        Fall Risk    03/11/2023    1:34 PM 03/19/2021    5:11 PM  Fall Risk   Falls in the past year? 0 0  Number falls in past yr: 0 0  Injury with Fall? 0 0  Risk for fall due to : No Fall Risks   Follow up Falls evaluation completed Falls evaluation completed     Medicare Risk   Cognitive Function Normal: Yes Exam Completed: mini-cog        Mini-Cog - 03/11/23 1334     Normal clock drawing test? yes    How many words correct? 2             Depression Screening    03/11/2023    1:34 PM 03/19/2021    5:08  PM  Depression screen PHQ 2/9  Decreased Interest 0 0  Down, Depressed, Hopeless 0 0  PHQ - 2 Score 0 0     Activities of Daily Living    03/11/2023    1:36 PM  In your present state of health, do you have any difficulty performing the following activities:  Hearing? 0  Vision? 0  Difficulty concentrating or making decisions? 0  Walking or climbing stairs? 0  Dressing or bathing? 0  Doing errands, shopping? 0  Preparing Food and eating ? N  Using the Toilet? N  In the past six months, have you accidently leaked urine? N  Do you have problems with loss of bowel control? N  Managing your Medications? N  Managing your Finances? N  Housekeeping or managing your Housekeeping? N    Tobacco Social History   Tobacco Use  Smoking Status Never   Passive exposure: Never  Smokeless Tobacco Never  Tobacco Comments   No history     Counseling  given: Not Answered Tobacco comments: No history   Hospitalizations in the Past Year: none  ED Visits in the Past Year: No  Surgeries in the Past Year: Yes   History    Medication List Current Meds  Medication Sig   alfuzosin  (UROXATRAL ) 10 MG 24 hr tablet TAKE 1 TABLET BY MOUTH AS NEEDED.   aspirin 81 MG tablet Take 81 mg by mouth daily.   lisinopril -hydrochlorothiazide  (ZESTORETIC ) 20-12.5 MG tablet Take 1 tablet by mouth daily.   [DISCONTINUED] finasteride  (PROSCAR ) 5 MG tablet Take 1 tablet (5 mg total) by mouth daily.   [DISCONTINUED] lisinopril -hydrochlorothiazide  (ZESTORETIC ) 10-12.5 MG tablet Take 1 tablet by mouth daily.   [DISCONTINUED] methocarbamol  (ROBAXIN ) 500 MG tablet Take 1-2 tablets (500-1,000 mg total) by mouth 3 (three) times daily as needed.   [DISCONTINUED] pravastatin  (PRAVACHOL ) 20 MG tablet Take 1 tablet (20 mg total) by mouth daily.     Immunizations Immunization History  Administered Date(s) Administered   Fluad  Trivalent(High Dose 65+) 11/20/2022   Influenza, High Dose Seasonal PF 12/01/2017   Influenza-Unspecified 11/15/2021   PFIZER(Purple Top)SARS-COV-2 Vaccination 04/10/2019, 05/05/2019   PNEUMOCOCCAL CONJUGATE-20 03/12/2023   Pfizer Covid-19 Vaccine Bivalent Booster 67yrs & up 02/05/2021, 08/20/2021   Pfizer(Comirnaty )Fall Seasonal Vaccine 12 years and older 01/08/2022, 06/04/2022, 11/15/2022   Pneumococcal Polysaccharide-23 09/27/2018   Respiratory Syncytial Virus Vaccine ,Recomb Aduvanted(Arexvy ) 01/14/2022   Tdap 01/28/2022     Screening Tests Health Maintenance  Topic Date Due   Zoster Vaccines- Shingrix (1 of 2) 06/09/2023 (Originally 04/23/2002)   Medicare Annual Wellness (AWV)  03/18/2024   DTaP/Tdap/Td (2 - Td or Tdap) 01/29/2032   Colonoscopy  05/09/2032   Pneumonia Vaccine 68+ Years old  Completed   INFLUENZA VACCINE  Completed   COVID-19 Vaccine  Completed   HPV VACCINES  Aged Out   Hepatitis C Screening  Discontinued     Health Maintenance Screenings  The patient has no Health Maintenance topics of status Overdue, Due On, or Due Soon   RSV Vaccine: recently resulted is reviewed with the patient  Past Medical History:  Diagnosis Date   Arthritis 2024   Minor joint discomfort   Cataract 11/23   Minor   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   Hypertension    Chronic   Rotator cuff impingement syndrome of right shoulder 07/07/2020   Past Surgical History:  Procedure Laterality Date   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   HERNIA REPAIR  1990's   Umbilical hernia   Family History  Problem Relation Age of Onset   Dementia Mother    Hypertension Mother    Varicose Veins Mother    Cancer Father        prostate   Coronary artery disease Father    Rheum arthritis Father    Arthritis Father    Madisynn Plair death Father    Heart disease Father    Social History   Socioeconomic History   Marital status: Married    Spouse name: Not on file   Number of children: Not on file   Years of education: Not on file   Highest education level: Master's degree (e.g., MA, MS, MEng, MEd, MSW, MBA)  Occupational History   Not on file  Tobacco Use   Smoking status: Never    Passive exposure: Never   Smokeless tobacco: Never   Tobacco comments:    No history  Vaping Use   Vaping status: Never Used  Substance and Sexual Activity   Alcohol use: Yes    Alcohol/week: 6.0 - 7.0 standard drinks of alcohol    Types: 6 - 7 Glasses of wine per week   Drug use: Never   Sexual activity: Yes    Birth control/protection: None  Other Topics Concern   Not on file  Social History Narrative   Not on file   Social Drivers of Health   Financial Resource Strain: Low Risk  (03/07/2023)   Overall Financial Resource Strain (CARDIA)    Difficulty of Paying Living Expenses: Not hard at all  Food Insecurity: No Food Insecurity (03/07/2023)   Hunger Vital Sign    Worried About Running Out of Food in the Last  Year: Never true    Ran Out of Food in the Last Year: Never true  Transportation Needs: No Transportation Needs (03/07/2023)   PRAPARE - Administrator, Civil Service (Medical): No    Lack of Transportation (Non-Medical): No  Physical Activity: Sufficiently Active (03/07/2023)   Exercise Vital Sign    Days of Exercise per Week: 7 days    Minutes of Exercise per Session: 100 min  Stress: No Stress Concern Present (03/07/2023)   [employer] of Occupational Health - Occupational Stress Questionnaire    Feeling of Stress : Not at all  Social Connections: Socially Integrated (03/07/2023)   Social Connection and Isolation Panel [NHANES]    Frequency of Communication with Friends and Family: Twice a week    Frequency of Social Gatherings with Friends and Family: Once a week    Attends Religious Services: 1 to 4 times per year    Active Member of [employer] or Organizations: Yes    Attends Banker Meetings: More than 4 times per year    Marital Status: Married    Outpatient Encounter Medications as of 03/11/2023  Medication Sig   alfuzosin  (UROXATRAL ) 10 MG 24 hr tablet TAKE 1 TABLET BY MOUTH AS NEEDED.   aspirin 81 MG tablet Take 81 mg by mouth daily.   lisinopril -hydrochlorothiazide  (ZESTORETIC ) 20-12.5 MG tablet Take 1 tablet by mouth daily.   [DISCONTINUED] finasteride  (PROSCAR ) 5 MG tablet Take 1 tablet (5 mg total) by mouth daily.   [DISCONTINUED] lisinopril -hydrochlorothiazide  (ZESTORETIC ) 10-12.5 MG tablet Take 1 tablet by mouth daily.   [DISCONTINUED] methocarbamol  (ROBAXIN ) 500 MG tablet Take 1-2 tablets (500-1,000 mg total) by mouth 3 (three) times daily as needed.   [DISCONTINUED] pravastatin  (PRAVACHOL ) 20 MG tablet Take 1 tablet (20 mg  total) by mouth daily.   COVID-19 mRNA vaccine 2023-2024 (COMIRNATY ) syringe Inject into the muscle. (Patient not taking: Reported on 03/11/2023)   finasteride  (PROSCAR ) 5 MG tablet Take 1 tablet (5 mg total) by mouth daily.    methocarbamol  (ROBAXIN ) 500 MG tablet Take 1-2 tablets (500-1,000 mg total) by mouth 3 (three) times daily as needed.   pravastatin  (PRAVACHOL ) 20 MG tablet Take 1 tablet (20 mg total) by mouth daily.   RSV vaccine recomb adjuvanted (AREXVY ) 120 MCG/0.5ML injection Inject into the muscle. (Patient not taking: Reported on 03/11/2023)   [DISCONTINUED] azelastine (ASTELIN) 0.1 % nasal spray Place into the nose.   [DISCONTINUED] influenza vaccine adjuvanted (FLUAD ) 0.5 ML injection Inject into the muscle. (Patient not taking: Reported on 03/11/2023)   [DISCONTINUED] neomycin -polymyxin-hydrocortisone (CORTISPORIN) otic solution Apply 1-2 drops to toe after soaking BID   [DISCONTINUED] Tdap (BOOSTRIX ) 5-2.5-18.5 LF-MCG/0.5 injection Inject into the muscle. (Patient not taking: Reported on 03/11/2023)   No facility-administered encounter medications on file as of 03/11/2023.    Physical Exam: No Physical Exam  PLAN Hypertension Blood pressure readings at home are consistently in the 140s/80s range. No associated symptoms such as headaches, vision changes, or dizziness. Previously managed with HCTZ 12.5/20 mg, considering increasing the dose due to current readings. Discussed potential need to adjust medication dosage based on blood pressure trends and possibility of reducing the dose again if blood pressure stabilizes. - Increase HCTZ to 20/12.5 mg - Monitor blood pressure at home and report if it does not decrease  Benign Prostatic Hyperplasia (BPH) Managed with alfuzosin  and finasteride . PSA levels stable (2.1 this year, 2.2 last year). Reports increased urgency and frequency of urination, no incontinence. MRI shows benign hypertrophy with no high-risk lesions. Discussed stability of PSA levels and potential for prostate enlargement despite stable PSA. Prefers to avoid surgical intervention and is open to monitoring symptoms and PSA levels closely. - Continue alfuzosin  and finasteride  - Monitor symptoms  and PSA levels - Consider urology referral if symptoms worsen in 2025  General Health Maintenance Up to date on flu and COVID vaccinations. Needs pneumococcal vaccination. Engages in regular physical activity and participates in senior games. Discussed importance of maintaining physical activity for overall health and weight management. - Administer Prevnar 20  vaccine - Encourage continued physical activity and weight management  Follow-up - Schedule annual wellness exam in 12 months - Use MyChart for any interim concerns or questions. Exercise Activities and Dietary Recommendations - choose a type of activity I enjoy such as biking, gardening, team sports, walking - use fitness equipment - go out for a short walk before breakfast, after dinner or both Cardiac and Carb modified diet  Fall Prevention - always use handrails on the stairs - always wear shoes or slippers with non-slip sole - get at least 10 minutes of activity every day  No orders of the defined types were placed in this encounter.    I have personally reviewed and noted the following in the patient's chart:   Medical and social history Use of alcohol, tobacco or illicit drugs  Current medications and supplements Functional ability and status Nutritional status Physical activity Advanced directives List of other physicians Hospitalizations, surgeries, and ER visits in previous 12 months Vitals Screenings to include cognitive, depression, and falls Referrals and appointments  In addition, I have reviewed and discussed with patient certain preventive protocols, quality metrics, and best practice recommendations. A written personalized care plan for preventive services as well as general preventive health recommendations were  provided to patient.   Camie CHARLENA Doing, NP  03/11/2023

## 2023-03-11 NOTE — Patient Instructions (Addendum)
  Jeremy Yates , Thank you for taking time to come for your Medicare Wellness Visit. I appreciate your ongoing commitment to your health goals. Please review the following plan we discussed and let me know if I can assist you in the future.   Your labs look very good. Everything appears stable right now. We will plan to recheck next year unless you have any questions.   I have sent in the increased dose of your blood pressure medication. Please let me know if your  blood pressure stays elevated even when increasing the dose.   I recommend having the Pneumonia 20 vaccine form the pharmacy when you have the chance.   These are the goals we discussed:  Goals       Patient Stated (pt-stated)      Lose weight    This is a list of the screening recommended for you and due dates:  Health Maintenance  Topic Date Due   Zoster (Shingles) Vaccine (1 of 2) 06/09/2023*   Pneumonia Vaccine (2 of 2 - PCV) 03/10/2024*   Medicare Annual Wellness Visit  02/20/2024   DTaP/Tdap/Td vaccine (2 - Td or Tdap) 01/29/2032   Colon Cancer Screening  05/09/2032   Flu Shot  Completed   COVID-19 Vaccine  Completed   HPV Vaccine  Aged Out   Hepatitis C Screening  Discontinued  *Topic was postponed. The date shown is not the original due date.

## 2023-03-12 ENCOUNTER — Other Ambulatory Visit (HOSPITAL_BASED_OUTPATIENT_CLINIC_OR_DEPARTMENT_OTHER): Payer: Self-pay

## 2023-03-12 MED ORDER — PREVNAR 20 0.5 ML IM SUSY
0.5000 mL | PREFILLED_SYRINGE | Freq: Once | INTRAMUSCULAR | 0 refills | Status: AC
  Filled 2023-03-12: qty 0.5, 1d supply, fill #0

## 2023-03-19 DIAGNOSIS — M62830 Muscle spasm of back: Secondary | ICD-10-CM | POA: Insufficient documentation

## 2023-03-19 DIAGNOSIS — Z0000 Encounter for general adult medical examination without abnormal findings: Secondary | ICD-10-CM

## 2023-03-19 HISTORY — DX: Encounter for general adult medical examination without abnormal findings: Z00.00

## 2023-05-19 ENCOUNTER — Other Ambulatory Visit (HOSPITAL_BASED_OUTPATIENT_CLINIC_OR_DEPARTMENT_OTHER): Payer: Self-pay

## 2023-06-05 ENCOUNTER — Other Ambulatory Visit: Payer: Self-pay | Admitting: Nurse Practitioner

## 2023-06-05 DIAGNOSIS — L03011 Cellulitis of right finger: Secondary | ICD-10-CM

## 2023-06-05 MED ORDER — CEPHALEXIN 500 MG PO CAPS: 500.0000 mg | ORAL_CAPSULE | Freq: Two times a day (BID) | ORAL | 0 refills | Status: DC

## 2023-06-19 ENCOUNTER — Other Ambulatory Visit (HOSPITAL_BASED_OUTPATIENT_CLINIC_OR_DEPARTMENT_OTHER): Payer: Self-pay

## 2023-06-19 MED ORDER — COVID-19 MRNA VAC-TRIS(PFIZER) 30 MCG/0.3ML IM SUSY
0.3000 mL | PREFILLED_SYRINGE | Freq: Once | INTRAMUSCULAR | 0 refills | Status: AC
  Filled 2023-06-19: qty 0.3, 1d supply, fill #0

## 2023-08-28 ENCOUNTER — Other Ambulatory Visit (HOSPITAL_BASED_OUTPATIENT_CLINIC_OR_DEPARTMENT_OTHER): Payer: Self-pay | Admitting: Nurse Practitioner

## 2023-08-28 DIAGNOSIS — N400 Enlarged prostate without lower urinary tract symptoms: Secondary | ICD-10-CM

## 2023-11-05 ENCOUNTER — Other Ambulatory Visit (HOSPITAL_BASED_OUTPATIENT_CLINIC_OR_DEPARTMENT_OTHER): Payer: Self-pay

## 2023-11-05 MED ORDER — FLUZONE HIGH-DOSE 0.5 ML IM SUSY
0.5000 mL | PREFILLED_SYRINGE | Freq: Once | INTRAMUSCULAR | 0 refills | Status: AC
  Filled 2023-11-05: qty 0.5, 1d supply, fill #0

## 2023-11-11 ENCOUNTER — Other Ambulatory Visit (HOSPITAL_BASED_OUTPATIENT_CLINIC_OR_DEPARTMENT_OTHER): Payer: Self-pay

## 2023-11-11 ENCOUNTER — Encounter: Payer: Self-pay | Admitting: Nurse Practitioner

## 2023-11-11 ENCOUNTER — Other Ambulatory Visit: Payer: Self-pay

## 2023-11-11 DIAGNOSIS — I10 Essential (primary) hypertension: Secondary | ICD-10-CM

## 2023-11-11 MED ORDER — COVID-19 MRNA VAC-TRIS(PFIZER) 30 MCG/0.3ML IM SUSY
PREFILLED_SYRINGE | INTRAMUSCULAR | 0 refills | Status: DC
  Filled 2023-11-15: qty 0.3, 1d supply, fill #0

## 2023-11-12 ENCOUNTER — Other Ambulatory Visit (HOSPITAL_BASED_OUTPATIENT_CLINIC_OR_DEPARTMENT_OTHER): Payer: Self-pay

## 2023-11-15 ENCOUNTER — Other Ambulatory Visit (HOSPITAL_BASED_OUTPATIENT_CLINIC_OR_DEPARTMENT_OTHER): Payer: Self-pay

## 2023-11-15 MED ORDER — COMIRNATY 30 MCG/0.3ML IM SUSY
0.3000 mL | PREFILLED_SYRINGE | Freq: Once | INTRAMUSCULAR | 0 refills | Status: DC
  Filled 2023-11-15: qty 0.3, 1d supply, fill #0

## 2023-11-17 ENCOUNTER — Other Ambulatory Visit (HOSPITAL_BASED_OUTPATIENT_CLINIC_OR_DEPARTMENT_OTHER): Payer: Self-pay

## 2023-12-31 ENCOUNTER — Encounter: Payer: Self-pay | Admitting: Nurse Practitioner

## 2024-01-01 ENCOUNTER — Other Ambulatory Visit: Payer: Self-pay

## 2024-01-01 DIAGNOSIS — Z0184 Encounter for antibody response examination: Secondary | ICD-10-CM

## 2024-01-02 LAB — MEASLES/MUMPS/RUBELLA IMMUNITY
MUMPS ABS, IGG: <9 [AU]/ml — ABNORMAL LOW (ref >10.9)
RUBEOLA AB, IGG: >300 [AU]/ml (ref >16.4)
Rubella Antibodies, IGG: 16.6 {index} (ref >0.99)

## 2024-01-05 ENCOUNTER — Ambulatory Visit: Payer: Self-pay | Admitting: Nurse Practitioner

## 2024-01-05 DIAGNOSIS — Z2839 Other underimmunization status: Secondary | ICD-10-CM

## 2024-01-05 NOTE — Telephone Encounter (Signed)
 Copied from CRM #[id number]. Topic: Appointments - Scheduling Inquiry for Clinic >> Jan 05, 2024 11:41 AM Thersia BROCKS wrote: Reason for CRM: Patient called in regarding MMR vaccine, needs to be called back once is able to be scheduled

## 2024-01-06 ENCOUNTER — Ambulatory Visit

## 2024-01-06 ENCOUNTER — Other Ambulatory Visit (HOSPITAL_BASED_OUTPATIENT_CLINIC_OR_DEPARTMENT_OTHER): Payer: Self-pay

## 2024-01-06 MED ORDER — M-M-R II IJ SOLR
0.5000 mL | INTRAMUSCULAR | 0 refills | Status: DC
  Filled 2024-01-06: qty 0.5, 1d supply, fill #0

## 2024-01-07 ENCOUNTER — Other Ambulatory Visit (HOSPITAL_BASED_OUTPATIENT_CLINIC_OR_DEPARTMENT_OTHER): Payer: Self-pay

## 2024-01-07 ENCOUNTER — Other Ambulatory Visit: Payer: Self-pay

## 2024-01-07 MED ORDER — PRIORIX ~~LOC~~ SUSR
0.5000 mL | SUBCUTANEOUS | 0 refills | Status: DC
  Filled 2024-01-07: qty 0.5, fill #0
  Filled 2024-02-02: qty 1, 1d supply, fill #0

## 2024-02-02 ENCOUNTER — Other Ambulatory Visit (HOSPITAL_BASED_OUTPATIENT_CLINIC_OR_DEPARTMENT_OTHER): Payer: Self-pay

## 2024-02-05 ENCOUNTER — Other Ambulatory Visit (HOSPITAL_BASED_OUTPATIENT_CLINIC_OR_DEPARTMENT_OTHER): Payer: Self-pay

## 2024-02-10 ENCOUNTER — Other Ambulatory Visit: Payer: Self-pay | Admitting: Nurse Practitioner

## 2024-02-10 DIAGNOSIS — I10 Essential (primary) hypertension: Secondary | ICD-10-CM

## 2024-02-10 DIAGNOSIS — E782 Mixed hyperlipidemia: Secondary | ICD-10-CM

## 2024-02-11 ENCOUNTER — Other Ambulatory Visit: Payer: Self-pay

## 2024-02-11 ENCOUNTER — Encounter: Payer: Self-pay | Admitting: Nurse Practitioner

## 2024-02-11 DIAGNOSIS — Z0000 Encounter for general adult medical examination without abnormal findings: Secondary | ICD-10-CM

## 2024-02-11 DIAGNOSIS — Z131 Encounter for screening for diabetes mellitus: Secondary | ICD-10-CM

## 2024-02-11 DIAGNOSIS — E785 Hyperlipidemia, unspecified: Secondary | ICD-10-CM

## 2024-02-11 DIAGNOSIS — E66812 Obesity, class 2: Secondary | ICD-10-CM

## 2024-02-13 ENCOUNTER — Other Ambulatory Visit: Payer: Self-pay

## 2024-02-13 DIAGNOSIS — E785 Hyperlipidemia, unspecified: Secondary | ICD-10-CM

## 2024-02-13 DIAGNOSIS — Z131 Encounter for screening for diabetes mellitus: Secondary | ICD-10-CM

## 2024-02-13 DIAGNOSIS — E66812 Obesity, class 2: Secondary | ICD-10-CM

## 2024-02-13 DIAGNOSIS — Z0000 Encounter for general adult medical examination without abnormal findings: Secondary | ICD-10-CM

## 2024-02-23 ENCOUNTER — Other Ambulatory Visit (HOSPITAL_BASED_OUTPATIENT_CLINIC_OR_DEPARTMENT_OTHER): Payer: Self-pay | Admitting: Nurse Practitioner

## 2024-02-23 DIAGNOSIS — N400 Enlarged prostate without lower urinary tract symptoms: Secondary | ICD-10-CM

## 2024-03-05 LAB — CBC WITH DIFFERENTIAL/PLATELET
Basophils Absolute: 0 x10E3/uL (ref 0.0–0.2)
Basos: 1 %
EOS (ABSOLUTE): 0.2 x10E3/uL (ref 0.0–0.4)
Eos: 3 %
Hematocrit: 42.5 % (ref 37.5–51.0)
Hemoglobin: 14.2 g/dL (ref 13.0–17.7)
Immature Grans (Abs): 0 x10E3/uL (ref 0.0–0.1)
Immature Granulocytes: 0 %
Lymphocytes Absolute: 1.5 x10E3/uL (ref 0.7–3.1)
Lymphs: 24 %
MCH: 31.6 pg (ref 26.6–33.0)
MCHC: 33.4 g/dL (ref 31.5–35.7)
MCV: 95 fL (ref 79–97)
Monocytes Absolute: 0.5 x10E3/uL (ref 0.1–0.9)
Monocytes: 8 %
Neutrophils Absolute: 3.9 x10E3/uL (ref 1.4–7.0)
Neutrophils: 64 %
Platelets: 229 x10E3/uL (ref 150–450)
RBC: 4.49 x10E6/uL (ref 4.14–5.80)
RDW: 11.8 % (ref 11.6–15.4)
WBC: 6.1 x10E3/uL (ref 3.4–10.8)

## 2024-03-05 LAB — PSA: Prostate Specific Ag, Serum: 2.3 ng/mL (ref 0.0–4.0)

## 2024-03-05 LAB — CMP14+EGFR
ALT: 17 IU/L (ref 0–44)
AST: 21 IU/L (ref 0–40)
Albumin: 4.4 g/dL (ref 3.8–4.8)
Alkaline Phosphatase: 64 IU/L (ref 47–123)
BUN/Creatinine Ratio: 23 (ref 10–24)
BUN: 20 mg/dL (ref 8–27)
Bilirubin Total: 0.7 mg/dL (ref 0.0–1.2)
CO2: 23 mmol/L (ref 20–29)
Calcium: 9.4 mg/dL (ref 8.6–10.2)
Chloride: 104 mmol/L (ref 96–106)
Creatinine, Ser: 0.87 mg/dL (ref 0.76–1.27)
Globulin, Total: 2.5 g/dL (ref 1.5–4.5)
Glucose: 100 mg/dL — ABNORMAL HIGH (ref 70–99)
Potassium: 4.1 mmol/L (ref 3.5–5.2)
Sodium: 142 mmol/L (ref 134–144)
Total Protein: 6.9 g/dL (ref 6.0–8.5)
eGFR: 92 mL/min/1.73

## 2024-03-05 LAB — LIPID PANEL
Chol/HDL Ratio: 2.8 ratio (ref 0.0–5.0)
Cholesterol, Total: 166 mg/dL (ref 100–199)
HDL: 60 mg/dL
LDL Chol Calc (NIH): 85 mg/dL (ref 0–99)
Triglycerides: 116 mg/dL (ref 0–149)
VLDL Cholesterol Cal: 21 mg/dL (ref 5–40)

## 2024-03-05 LAB — HEMOGLOBIN A1C
Est. average glucose Bld gHb Est-mCnc: 126 mg/dL
Hgb A1c MFr Bld: 6 % — ABNORMAL HIGH (ref 4.8–5.6)

## 2024-03-05 LAB — TSH: TSH: 3.56 u[IU]/mL (ref 0.450–4.500)

## 2024-03-08 ENCOUNTER — Ambulatory Visit: Payer: Self-pay | Admitting: Nurse Practitioner

## 2024-03-08 DIAGNOSIS — R7303 Prediabetes: Secondary | ICD-10-CM

## 2024-03-11 ENCOUNTER — Ambulatory Visit (INDEPENDENT_AMBULATORY_CARE_PROVIDER_SITE_OTHER): Payer: Medicare Other | Admitting: Nurse Practitioner

## 2024-03-11 ENCOUNTER — Encounter: Payer: Self-pay | Admitting: Nurse Practitioner

## 2024-03-11 VITALS — BP 122/80 | HR 78 | Ht 65.0 in | Wt 250.6 lb

## 2024-03-11 DIAGNOSIS — M62830 Muscle spasm of back: Secondary | ICD-10-CM

## 2024-03-11 DIAGNOSIS — E6609 Other obesity due to excess calories: Secondary | ICD-10-CM | POA: Diagnosis not present

## 2024-03-11 DIAGNOSIS — Z0000 Encounter for general adult medical examination without abnormal findings: Secondary | ICD-10-CM | POA: Diagnosis not present

## 2024-03-11 DIAGNOSIS — E66812 Obesity, class 2: Secondary | ICD-10-CM

## 2024-03-11 DIAGNOSIS — E782 Mixed hyperlipidemia: Secondary | ICD-10-CM

## 2024-03-11 DIAGNOSIS — Z6839 Body mass index (BMI) 39.0-39.9, adult: Secondary | ICD-10-CM

## 2024-03-11 DIAGNOSIS — N401 Enlarged prostate with lower urinary tract symptoms: Secondary | ICD-10-CM

## 2024-03-11 DIAGNOSIS — I10 Essential (primary) hypertension: Secondary | ICD-10-CM

## 2024-03-11 DIAGNOSIS — R234 Changes in skin texture: Secondary | ICD-10-CM | POA: Insufficient documentation

## 2024-03-11 DIAGNOSIS — N400 Enlarged prostate without lower urinary tract symptoms: Secondary | ICD-10-CM | POA: Diagnosis not present

## 2024-03-11 MED ORDER — PRAVASTATIN SODIUM 20 MG PO TABS: 20.0000 mg | ORAL_TABLET | Freq: Every day | ORAL | 3 refills | Status: AC

## 2024-03-11 MED ORDER — FINASTERIDE 5 MG PO TABS: 5.0000 mg | ORAL_TABLET | Freq: Every day | ORAL | 3 refills | Status: AC

## 2024-03-11 MED ORDER — ASPIRIN 81 MG PO TBEC: 81.0000 mg | DELAYED_RELEASE_TABLET | Freq: Every day | ORAL | 3 refills | Status: AC

## 2024-03-11 MED ORDER — ALFUZOSIN HCL ER 10 MG PO TB24: 10.0000 mg | ORAL_TABLET | Freq: Every day | ORAL | 3 refills | Status: AC

## 2024-03-11 MED ORDER — LISINOPRIL-HYDROCHLOROTHIAZIDE 20-12.5 MG PO TABS: 1.0000 | ORAL_TABLET | Freq: Every day | ORAL | 3 refills | Status: AC

## 2024-03-11 NOTE — Assessment & Plan Note (Signed)
 Current regimen effective for management. No changes at this time.

## 2024-03-11 NOTE — Assessment & Plan Note (Signed)
 Weight has decreased by 21 pounds since January, indicating progress in weight management. He is actively engaged in exercise and dietary modifications. - Continue current exercise and dietary regimen.

## 2024-03-11 NOTE — Assessment & Plan Note (Signed)
 PSA levels are stable with minor fluctuations noted since 2021. Previous MRI's reviewed with patient today- they show signs of BPH with no concerning findings. PiRADS2. Symptoms include nocturia and incomplete bladder emptying. Family history of prostate cancer. Current medications include alfuzosin  and finasteride . Discussed the importance of monitoring PSA trends and general guidelines for increases >0.75 annually to trigger increased concern and additional monitoring. He is aware of the potential for aggressive prostate cancer and the side effects of treatment options. At this time, given his overall health status, a joint decision was made to continue with annual PSA testing for monitoring.  - Continue alfuzosin  and finasteride . - Monitor PSA levels annually and symptoms regularly. - Notify if any new or worsening symptoms present.  - Will consider urology referral if symptoms worsen or changes are noted.

## 2024-03-11 NOTE — Assessment & Plan Note (Signed)
 Muscle spasms in the lower back are managed with methocarbamol  on an as needed basis. Symptoms have improved with exercise and stretching routines. - Continue methocarbamol  as needed. - Continue exercise and stretching routines.

## 2024-03-11 NOTE — Assessment & Plan Note (Signed)
 Right medial calf showing appx 1.5-2cm area of scaling texture. Edge around area palpable deeper into the dermis. Recommend dermatology f/u for further evaluation.

## 2024-03-11 NOTE — Patient Instructions (Signed)
 Mr. Asbridge,  Thank you for taking the time for your Medicare Wellness Visit. I appreciate your continued commitment to your health goals. Please review the care plan we discussed, and feel free to reach out if I can assist you further.  Please note that Annual Wellness Visits do not include a physical exam. Some assessments may be limited, especially if the visit was conducted virtually. If needed, we may recommend an in-person follow-up with your provider.  Ongoing Care Seeing your primary care provider every 3 to 6 months helps us  monitor your health and provide consistent, personalized care.   Referrals If a referral was made during today's visit and you haven't received any updates within two weeks, please contact the referred provider directly to check on the status.  Recommended Screenings:  Health Maintenance  Topic Date Due   Zoster (Shingles) Vaccine (1 of 2) Never done   Medicare Annual Wellness Visit  03/10/2024   COVID-19 Vaccine (10 - 2025-26 season) 05/14/2024   DTaP/Tdap/Td vaccine (2 - Td or Tdap) 01/29/2032   Colon Cancer Screening  05/09/2032   Pneumococcal Vaccine for age over 59  Completed   Flu Shot  Completed   Meningitis B Vaccine  Aged Out   Hepatitis C Screening  Discontinued       03/11/2024    2:03 PM  Advanced Directives  Does Patient Have a Medical Advance Directive? Yes  Type of Advance Directive Living will;Healthcare Power of Attorney  Copy of Healthcare Power of Attorney in Chart? No - copy requested    Vision: Annual vision screenings are recommended for Christon Gallaway detection of glaucoma, cataracts, and diabetic retinopathy. These exams can also reveal signs of chronic conditions such as diabetes and high blood pressure.  Dental: Annual dental screenings help detect Shawntina Diffee signs of oral cancer, gum disease, and other conditions linked to overall health, including heart disease and diabetes.

## 2024-03-11 NOTE — Progress Notes (Signed)
 "  Chief Complaint  Patient presents with   Medicare Wellness    Medicare wellness, has some increased heart burn,      Subjective:   Jeremy Yates is a 74 y.o. male who presents for a Medicare Annual Wellness Visit.  History of Present Illness Jeremy Yates is a 74 year old male here for annual medicare wellness exam.   He has a history of prostate issues, including frequent nocturia. He is currently on alfuzosin  and finasteride  for urinary symptoms and reports good flow but not complete resolution of the sensation of incomplete bladder emptying. The effectiveness the medicatoin has decreased over time, though he is not experiencing dribbling or incontinence, He reports his stream is strong and there is no hesitation. His PSA levels have fluctuated slightly, with a recent reading of 2.3 (previous readings 2.1, 2.3). There is a family history of prostate cancer, as his father had the condition. He has had prostate MRI's in the past with Linear/wedge shaped hypointensity is seen on ADC; however, no focal ADC hypointense or high b-value DWI hyperintense nodules are seen on today's exam. Normal appearance to transition/central zone in 2021,  Slightly enlarged with mild BPH changes noted; however, no suspicious nodules are identified on T2-weighted or diffusion sequences noted in 2022 and Signs of BPH in the transitional zone. No high-risk lesion. Thinning of the peripheral zone with linear and wedge-shaped T2 hypointensity in 2023. Overall assessment PIRADS category 2 in 2021 and 2023. Change of 19cc volume (2021, 2022) to 13cc volume (2023). He has concerns about the PSA changes and trends over the past several years.   He has a history of seborrheic keratosis and is under regular dermatological care. He notes a change in size of one lesion on his right medial calf from dime to nickel size, which he plans to address in his next dermatology appointment. The area has a firm, deep border and  scaling noted. He has tried topical 5-FU as directed for his face, did not note any changes to the lesion.   He has a history of COVID-19 and has previously used Paxlovid, which he found effective despite experiencing a rebound. He is up to date with COVID vaccinations. He asks if he were to test positive if a prescription of Paxlovid could be sent for him.   He has a history of aortic valve replacement and reports no current cardiac symptoms. He engages in regular exercise, including cardio and strength training, and monitors his heart rate during workouts.    He has mild cataracts and is monitored annually by an ophthalmologist.  He takes finasteride , alfuzosin , pravastatin , and methocarbamol . He reports good management of his muscle spasms with current exercise routines and medication use.  He has lost 21 pounds since January and feels better not carrying as much weight as before.    Visit info / Clinical Intake: Medicare Wellness Visit Type:: Subsequent Annual Wellness Visit Persons participating in visit and providing information:: patient Medicare Wellness Visit Mode:: In-person (required for WTM) Interpreter Needed?: No Pre-visit prep was completed: yes AWV questionnaire completed by patient prior to visit?: yes Date:: 03/11/24 Living arrangements:: lives with spouse/significant other Patient's Overall Health Status Rating: good Typical amount of pain: none Does pain affect daily life?: no Are you currently prescribed opioids?: no  Dietary Habits and Nutritional Risks How many meals a day?: 3 Eats fruit and vegetables daily?: yes Most meals are obtained by: preparing own meals In the last 2 weeks, have you had any  of the following?: none Diabetic:: no  Functional Status Activities of Daily Living (to include ambulation/medication): Independent Ambulation: Independent Medication Administration: Independent Home Management (perform basic housework or laundry):  Independent Manage your own finances?: yes Primary transportation is: driving Concerns about vision?: no *vision screening is required for WTM* Concerns about hearing?: no  Fall Screening Falls in the past year?: 0 Number of falls in past year: 0 Was there an injury with Fall?: 0 Fall Risk Category Calculator: 0 Patient Fall Risk Level: Low Fall Risk  Fall Risk Patient at Risk for Falls Due to: No Fall Risks Fall risk Follow up: Falls prevention discussed  Home and Transportation Safety: All rugs have non-skid backing?: yes All stairs or steps have railings?: yes Grab bars in the bathtub or shower?: (!) no Have non-skid surface in bathtub or shower?: yes Good home lighting?: yes Regular seat belt use?: yes Hospital stays in the last year:: no  Cognitive Assessment Difficulty concentrating, remembering, or making decisions? : no Will 6CIT or Mini Cog be Completed: no 6CIT or Mini Cog Declined: patient declined  Advance Directives (For Healthcare) Does Patient Have a Medical Advance Directive?: Yes Type of Advance Directive: Living will; Healthcare Power of Attorney Copy of Healthcare Power of Attorney in Chart?: No - copy requested Copy of Living Will in Chart?: No - copy requested  Reviewed/Updated  Reviewed/Updated: Reviewed All (Medical, Surgical, Family, Medications, Allergies, Care Teams, Patient Goals)    Allergies (verified) Patient has no allergy information on record.   Current Medications (verified) Outpatient Encounter Medications as of 03/11/2024  Medication Sig   alfuzosin  (UROXATRAL ) 10 MG 24 hr tablet Take 1 tablet (10 mg total) by mouth daily.   aspirin  EC 81 MG tablet Take 1 tablet (81 mg total) by mouth daily. Swallow whole.   finasteride  (PROSCAR ) 5 MG tablet Take 1 tablet (5 mg total) by mouth daily.   lisinopril -hydrochlorothiazide  (ZESTORETIC ) 20-12.5 MG tablet Take 1 tablet by mouth daily.   methocarbamol  (ROBAXIN ) 500 MG tablet Take 1-2 tablets  (500-1,000 mg total) by mouth 3 (three) times daily as needed.   pravastatin  (PRAVACHOL ) 20 MG tablet Take 1 tablet (20 mg total) by mouth daily.   [DISCONTINUED] alfuzosin  (UROXATRAL ) 10 MG 24 hr tablet TAKE 1 TABLET BY MOUTH EVERY DAY AS NEEDED (Patient taking differently: Take 10 mg by mouth daily.)   [DISCONTINUED] aspirin  81 MG tablet Take 81 mg by mouth daily.   [DISCONTINUED] finasteride  (PROSCAR ) 5 MG tablet Take 1 tablet (5 mg total) by mouth daily.   [DISCONTINUED] lisinopril -hydrochlorothiazide  (ZESTORETIC ) 20-12.5 MG tablet TAKE 1 TABLET BY MOUTH EVERY DAY   [DISCONTINUED] pravastatin  (PRAVACHOL ) 20 MG tablet TAKE 1 TABLET BY MOUTH EVERY DAY   [DISCONTINUED] cephALEXin  (KEFLEX ) 500 MG capsule Take 1 capsule (500 mg total) by mouth 2 (two) times daily.   [DISCONTINUED] COVID-19 mRNA vaccine 2023-2024 (COMIRNATY ) syringe Inject into the muscle. (Patient not taking: Reported on 03/11/2023)   [DISCONTINUED] COVID-19 mRNA vaccine, Pfizer, (COMIRNATY ) syringe Inject into the muscle.   [DISCONTINUED] measles , mumps & rubella vaccine (PRIORIX ) injection Inject 0.5 mLs into the muscle.   [DISCONTINUED] RSV vaccine recomb adjuvanted (AREXVY ) 120 MCG/0.5ML injection Inject into the muscle. (Patient not taking: Reported on 03/11/2023)   No facility-administered encounter medications on file as of 03/11/2024.    History: Past Medical History:  Diagnosis Date   Arthritis 2024   Minor joint discomfort   Cataract 11/23   Minor   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  Hypertension    Chronic   Medicare annual wellness visit, subsequent 03/19/2023   Recurrent epistaxis 11/19/2017   Rotator cuff impingement syndrome of right shoulder 07/07/2020   Past Surgical History:  Procedure Laterality Date   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   HERNIA REPAIR  1990s   Umbilical hernia   Family History  Problem Relation Age of Onset   Dementia Mother    Hypertension Mother     Varicose Veins Mother    Cancer Father        prostate   Coronary artery disease Father    Rheum arthritis Father    Arthritis Father    Joylene Wescott death Father    Heart disease Father    Social History   Occupational History   Not on file  Tobacco Use   Smoking status: Never    Passive exposure: Never   Smokeless tobacco: Never   Tobacco comments:    No history  Vaping Use   Vaping status: Never Used  Substance and Sexual Activity   Alcohol use: Yes    Alcohol/week: 6.0 - 7.0 standard drinks of alcohol    Types: 6 - 7 Glasses of wine per week   Drug use: Never   Sexual activity: Yes    Birth control/protection: None   Tobacco Counseling Counseling given: Not Answered Tobacco comments: No history  SDOH Screenings   Food Insecurity: No Food Insecurity (03/07/2023)  Housing: Unknown (05/28/2023)   Received from [facility]  Transportation Needs: No Transportation Needs (03/07/2023)  Alcohol Screen: Low Risk (03/07/2023)  Depression (PHQ2-9): Low Risk (03/11/2024)  Financial Resource Strain: Low Risk (03/07/2023)  Physical Activity: Sufficiently Active (03/07/2023)  Social Connections: Socially Integrated (03/07/2023)  Stress: No Stress Concern Present (03/07/2023)  Tobacco Use: Low Risk (03/11/2024)  Health Literacy: Adequate Health Literacy (03/11/2024)   See flowsheets for full screening details  Depression Screen PHQ 2 & 9 Depression Scale- Over the past 2 weeks, how often have you been bothered by any of the following problems? Little interest or pleasure in doing things: 0 Feeling down, depressed, or hopeless (PHQ Adolescent also includes...irritable): 0 PHQ-2 Total Score: 0 Trouble falling or staying asleep, or sleeping too much: 0 Feeling tired or having little energy: 0 Poor appetite or overeating (PHQ Adolescent also includes...weight loss): 0 Feeling bad about yourself - or that you are a failure or have let yourself or your family down: 0 Trouble  concentrating on things, such as reading the newspaper or watching television (PHQ Adolescent also includes...like school work): 0 Moving or speaking so slowly that other people could have noticed. Or the opposite - being so fidgety or restless that you have been moving around a lot more than usual: 0 Thoughts that you would be better off dead, or of hurting yourself in some way: 0 PHQ-9 Total Score: 0 If you checked off any problems, how difficult have these problems made it for you to do your work, take care of things at home, or get along with other people?: Not difficult at all     Goals Addressed               This Visit's Progress     Patient Stated (pt-stated)        Continue to loose weight             Objective:    Today's Vitals   03/11/24 1405  BP: 122/80  Pulse: 78  Weight:  250 lb 9.6 oz (113.7 kg)  Height: 5' 5 (1.651 m)   Body mass index is 41.7 kg/m.  Hearing/Vision screen No results found. Immunizations and Health Maintenance Health Maintenance  Topic Date Due   COVID-19 Vaccine (10 - 2025-26 season) 05/14/2024   Medicare Annual Wellness (AWV)  03/11/2025   DTaP/Tdap/Td (2 - Td or Tdap) 01/29/2032   Colonoscopy  05/09/2032   Pneumococcal Vaccine: 50+ Years  Completed   Influenza Vaccine  Completed   Zoster Vaccines- Shingrix  Completed   Meningococcal B Vaccine  Aged Out   Hepatitis C Screening  Discontinued        Assessment/Plan:  This is a routine wellness examination for Jeremy Yates.  Patient Care Team: Dajai Wahlert, Camie BRAVO, NP as PCP - General (Nurse Practitioner) Katina Albright, MD as Consulting Physician (Cardiology) Jefrey Bruckner, MD as Referring Physician (Internal Medicine) Leslee Reusing, MD as Consulting Physician (Ophthalmology)  I have personally reviewed and noted the following in the patients chart:   Medical and social history Use of alcohol, tobacco or illicit drugs  Current medications and supplements including opioid  prescriptions. Functional ability and status Nutritional status Physical activity Advanced directives List of other physicians Hospitalizations, surgeries, and ER visits in previous 12 months Vitals Screenings to include cognitive, depression, and falls Referrals and appointments  No orders of the defined types were placed in this encounter.  In addition, I have reviewed and discussed with patient certain preventive protocols, quality metrics, and best practice recommendations. A written personalized care plan for preventive services as well as general preventive health recommendations were provided to patient.   Addylynn Balin E. Allison Silva, NP   03/11/2024   Return in about 1 year (around 03/11/2025) for CPE.  After Visit Summary: (In Person-Printed) AVS printed and given to the patient   "

## 2024-03-11 NOTE — Assessment & Plan Note (Signed)
 Blood pressure is well controlled with current medication regimen. - Continue current antihypertensive medications

## 2025-03-14 ENCOUNTER — Encounter: Admitting: Nurse Practitioner
